# Patient Record
Sex: Male | Born: 1980 | Race: White | Hispanic: No | Marital: Married | Smoking: Former smoker
Health system: Southern US, Community
[De-identification: ages and names within clinical notes are randomized; demographics above are authoritative.]

## PROBLEM LIST (undated history)

## (undated) ENCOUNTER — Emergency Department: Discharge status: Absent without leave | Payer: 59

## (undated) DIAGNOSIS — F419 Anxiety disorder, unspecified: Secondary | ICD-10-CM

## (undated) DIAGNOSIS — F319 Bipolar disorder, unspecified: Secondary | ICD-10-CM

## (undated) DIAGNOSIS — F329 Major depressive disorder, single episode, unspecified: Secondary | ICD-10-CM

## (undated) DIAGNOSIS — F1020 Alcohol dependence, uncomplicated: Secondary | ICD-10-CM

## (undated) DIAGNOSIS — F1010 Alcohol abuse, uncomplicated: Secondary | ICD-10-CM

## (undated) DIAGNOSIS — F32A Depression, unspecified: Secondary | ICD-10-CM

## (undated) DIAGNOSIS — Z87898 Personal history of other specified conditions: Secondary | ICD-10-CM

## (undated) DIAGNOSIS — Z818 Family history of other mental and behavioral disorders: Secondary | ICD-10-CM

## (undated) DIAGNOSIS — E669 Obesity, unspecified: Secondary | ICD-10-CM

## (undated) DIAGNOSIS — F988 Other specified behavioral and emotional disorders with onset usually occurring in childhood and adolescence: Secondary | ICD-10-CM

## (undated) DIAGNOSIS — F1921 Other psychoactive substance dependence, in remission: Secondary | ICD-10-CM

## (undated) DIAGNOSIS — F1991 Other psychoactive substance use, unspecified, in remission: Secondary | ICD-10-CM

## (undated) DIAGNOSIS — Z7289 Other problems related to lifestyle: Secondary | ICD-10-CM

## (undated) DIAGNOSIS — I10 Essential (primary) hypertension: Secondary | ICD-10-CM

## (undated) DIAGNOSIS — Z789 Other specified health status: Secondary | ICD-10-CM

## (undated) HISTORY — DX: Family history of other mental and behavioral disorders: Z81.8

## (undated) HISTORY — DX: Personal history of other specified conditions: Z87.898

## (undated) HISTORY — DX: Essential (primary) hypertension: I10

## (undated) HISTORY — DX: Alcohol abuse, uncomplicated: F10.10

## (undated) HISTORY — DX: Obesity, unspecified: E66.9

## (undated) HISTORY — DX: Other specified health status: Z78.9

## (undated) HISTORY — DX: Other psychoactive substance use, unspecified, in remission: F19.91

## (undated) HISTORY — DX: Other specified behavioral and emotional disorders with onset usually occurring in childhood and adolescence: F98.8

## (undated) HISTORY — DX: Anxiety disorder, unspecified: F41.9

## (undated) HISTORY — DX: Bipolar disorder, unspecified: F31.9

## (undated) HISTORY — DX: Alcohol dependence, uncomplicated: F10.20

## (undated) HISTORY — DX: Depression, unspecified: F32.A

## (undated) HISTORY — DX: Other problems related to lifestyle: Z72.89

## (undated) HISTORY — DX: Other psychoactive substance dependence, in remission: F19.21

---
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
## 2011-07-31 ENCOUNTER — Emergency Department
Admission: EM | Admit: 2011-07-31 | Discharge: 2011-07-31 | Disposition: A | Discharge status: Home / Self Care | Payer: Medicaid Other | Source: Home / Self Care

## 2011-07-31 ENCOUNTER — Encounter: Payer: Self-pay | Admitting: *Deleted

## 2011-07-31 DIAGNOSIS — J069 Acute upper respiratory infection, unspecified: Secondary | ICD-10-CM

## 2011-07-31 DIAGNOSIS — R05 Cough: Secondary | ICD-10-CM

## 2011-07-31 MED ORDER — AZITHROMYCIN 250 MG PO TABS: ORAL_TABLET | ORAL | Status: AC

## 2011-07-31 MED ORDER — GUAIFENESIN-CODEINE 100-10 MG/5ML PO SYRP: 5.0000 mL | ORAL_SOLUTION | Freq: Four times a day (QID) | ORAL | Status: AC | PRN

## 2011-07-31 NOTE — ED Notes (Signed)
Patient c/o non-productive cough and congestion x 4-5 days. Dizziness. Taken tylenol and zicam. Denies fever.

## 2011-07-31 NOTE — ED Provider Notes (Signed)
History     CSN: [id number]  Arrival date & time 07/31/11  6962   None     Chief Complaint  Patient presents with  . URI    (Consider location/radiation/quality/duration/timing/severity/associated sxs/prior treatment) HPI Supreme is a 31 y.o. male who complains of onset of cold symptoms for 4 days. + Flu shot. No sore throat + cough No pleuritic pain No wheezing + nasal congestion + post-nasal drainage No sinus pain/pressure + chest congestion No itchy/red eyes No earache No hemoptysis No SOB No chills/sweats No fever No nausea No vomiting No abdominal pain No diarrhea No skin rashes + fatigue No myalgias No headache    History reviewed. No pertinent past medical history.  History reviewed. No pertinent past surgical history.  History reviewed. No pertinent family history.  History  Substance Use Topics  . Smoking status: Never Smoker   . Smokeless tobacco: Not on file  . Alcohol Use: Yes      Review of Systems  Allergies  Review of patient's allergies indicates no known allergies.  Home Medications  No current outpatient prescriptions on file.  There were no vitals taken for this visit.  Physical Exam  Nursing note and vitals reviewed. Constitutional: He is oriented to person, place, and time. He appears well-developed and well-nourished.  HENT:  Head: Normocephalic and atraumatic.  Right Ear: Tympanic membrane, external ear and ear canal normal.  Left Ear: Tympanic membrane, external ear and ear canal normal.  Nose: Mucosal edema and rhinorrhea present.  Mouth/Throat: Posterior oropharyngeal erythema present. No oropharyngeal exudate or posterior oropharyngeal edema.       Cerumen impaction bilateral  Eyes: No scleral icterus.  Neck: Neck supple.  Cardiovascular: Regular rhythm and normal heart sounds.   Pulmonary/Chest: Effort normal and breath sounds normal. No respiratory distress.  Neurological: He is alert and oriented to person,  place, and time.  Skin: Skin is warm and dry.  Psychiatric: He has a normal mood and affect. His speech is normal.    ED Course  Procedures (including critical care time)  Labs Reviewed - No data to display No results found.   No diagnosis found.    MDM  1)  Take the prescribed antibiotic as instructed.   I have advised him to hold the antibiotics for a few days since is most likely viral. I gave him a note for work tonight because of prefer that he is not around any of patient's. We also gave him cough medicine with codeine. 2)  Use nasal saline solution (over the counter) at least 3 times a day. 3)  Use over the counter decongestants like Zyrtec-D every 12 hours as needed to help with congestion.  If you have hypertension, do not take medicines with sudafed.  4)  Can take tylenol every 6 hours or motrin every 8 hours for pain or fever. 5)  Follow up with your primary doctor if no improvement in 5-7 days, sooner if increasing pain, fever, or new symptoms.       Lily Kocher, MD 07/31/11 [phone]

## 2018-11-03 ENCOUNTER — Emergency Department (INDEPENDENT_AMBULATORY_CARE_PROVIDER_SITE_OTHER): Payer: 59

## 2018-11-03 ENCOUNTER — Encounter: Payer: Self-pay | Admitting: *Deleted

## 2018-11-03 ENCOUNTER — Other Ambulatory Visit: Payer: Self-pay

## 2018-11-03 ENCOUNTER — Emergency Department (INDEPENDENT_AMBULATORY_CARE_PROVIDER_SITE_OTHER)
Admission: EM | Admit: 2018-11-03 | Discharge: 2018-11-03 | Disposition: A | Discharge status: Home / Self Care | Payer: 59 | Source: Home / Self Care

## 2018-11-03 DIAGNOSIS — W010XXA Fall on same level from slipping, tripping and stumbling without subsequent striking against object, initial encounter: Secondary | ICD-10-CM | POA: Diagnosis not present

## 2018-11-03 DIAGNOSIS — S299XXA Unspecified injury of thorax, initial encounter: Secondary | ICD-10-CM | POA: Diagnosis not present

## 2018-11-03 DIAGNOSIS — R0781 Pleurodynia: Secondary | ICD-10-CM

## 2018-11-03 MED ORDER — TRAMADOL HCL 50 MG PO TABS: 50.0000 mg | ORAL_TABLET | Freq: Four times a day (QID) | ORAL | 0 refills | Status: DC | PRN

## 2018-11-03 MED ORDER — METHOCARBAMOL 500 MG PO TABS
500.0000 mg | ORAL_TABLET | Freq: Two times a day (BID) | ORAL | 0 refills | Status: DC
Start: 2018-11-03 — End: 2019-06-17

## 2018-11-03 NOTE — ED Provider Notes (Signed)
[facility]    CSN: [id number] Arrival date & time: 11/03/18  7253     History   Chief Complaint Chief Complaint  Patient presents with  . Rib Injury    HPI Nicholas Ortega is a 38 y.o. male.   HPI Nicholas Ortega is a 38 y.o. male presenting to UC with c/o Right sided chest, back, and rib pain after tripping on a step and falling over his son's toy scooter on Friday 10/29/2018.  Pain has gradually worsened in his ribs.  He initially had Right ankle pain but that has resolved.  He has taken Tylenol and ibuprofen but no relief of Right side pain.  Denies cough, congestion or SOB. Denies fever or chills. Denies hitting his head during the fall. No prior hx of back or rib problems.   History reviewed. No pertinent past medical history.  There are no active problems to display for this patient.   History reviewed. No pertinent surgical history.     Home Medications    Prior to Admission medications   Medication Sig Start Date End Date Taking? Authorizing Provider  methocarbamol (ROBAXIN) 500 MG tablet Take 1 tablet (500 mg total) by mouth 2 (two) times daily. 11/03/18   Lurene Shadow, PA-C  traMADol (ULTRAM) 50 MG tablet Take 1 tablet (50 mg total) by mouth every 6 (six) hours as needed. 11/03/18   Lurene Shadow, PA-C    Family History Family History  Problem Relation Age of Onset  . Transient ischemic attack Father     Social History Social History   Tobacco Use  . Smoking status: Former Games developer  . Smokeless tobacco: Never Used  Substance Use Topics  . Alcohol use: Yes    Comment: occ  . Drug use: No     Allergies   Hydrocodone   Review of Systems Review of Systems  Cardiovascular: Positive for chest pain. Negative for palpitations.  Musculoskeletal: Positive for back pain and myalgias. Negative for neck pain and neck stiffness.  Skin: Negative for color change and wound.  Neurological: Negative for syncope, light-headedness and headaches.      Physical Exam Triage Vital Signs ED Triage Vitals  Enc Vitals Group     BP 11/03/18 0951 (!) 127/92     Pulse Rate 11/03/18 0951 97     Resp 11/03/18 0951 18     Temp 11/03/18 0951 98.1 F (36.7 C)     Temp Source 11/03/18 0951 Oral     SpO2 11/03/18 0951 97 %     Weight 11/03/18 0952 (!) 364 lb (165.1 kg)     Height 11/03/18 0952  (1.854 m)     Head Circumference --      Peak Flow --      Pain Score 11/03/18 0952 8     Pain Loc --      Pain Edu? --      Excl. in GC? --    No data found.  Updated Vital Signs BP (!) 127/92 (BP Location: Right Arm)   Pulse 97   Temp 98.1 F (36.7 C) (Oral)   Resp 18   Ht  (1.854 m)   Wt (!) 364 lb (165.1 kg)   SpO2 97%   BMI 48.02 kg/m   Visual Acuity Right Eye Distance:   Left Eye Distance:   Bilateral Distance:    Right Eye Near:   Left Eye Near:    Bilateral Near:     Physical  Exam Vitals signs and nursing note reviewed.  Constitutional:      Appearance: Normal appearance. He is well-developed.  HENT:     Head: Normocephalic and atraumatic.  Neck:     Musculoskeletal: Normal range of motion.  Cardiovascular:     Rate and Rhythm: Normal rate and regular rhythm.  Pulmonary:     Effort: Pulmonary effort is normal.     Breath sounds: Normal breath sounds.    Chest:     Chest wall: Tenderness present. No deformity, swelling or crepitus.    Musculoskeletal: Normal range of motion.  Skin:    General: Skin is warm and dry.  Neurological:     Mental Status: He is alert and oriented to person, place, and time.  Psychiatric:        Behavior: Behavior normal.      UC Treatments / Results  Labs (all labs ordered are listed, but only abnormal results are displayed) Labs Reviewed - No data to display  EKG None  Radiology Dg Ribs Unilateral W/chest Right  Result Date: 11/03/2018 CLINICAL DATA:  Anterior lower right rib pain after a fall on 10/29/2018 EXAM: RIGHT RIBS AND CHEST - 3+ VIEW COMPARISON:  None.  FINDINGS: No fracture or other bone lesions are seen involving the ribs. There is no evidence of pneumothorax or pleural effusion. Both lungs are clear. Heart size and mediastinal contours are within normal limits. The patient's reported pain is over the noncalcified costal cartilage region. IMPRESSION: No visible right rib fracture. Electronically Signed   By: Francene Boyers M.D.   On: 11/03/2018 10:16    Procedures Procedures (including critical care time)  Medications Ordered in UC Medications - No data to display  Initial Impression / Assessment and Plan / UC Course  I have reviewed the triage vital signs and the nursing notes.  Pertinent labs & imaging results that were available during my care of the patient were reviewed by me and considered in my medical decision making (see chart for details).    Reviewed imaging with pt. Reassured pt no fractures Will tx with Tramadol and Robaxin Also encouraged pt to try alternating cool and warm compresses F/u with PCP or Sports Medicine as needed  Final Clinical Impressions(s) / UC Diagnoses   Final diagnoses:  Fall from slip, trip, or stumble, initial encounter  Rib pain on right side     Discharge Instructions      Tramadol is strong pain medication. While taking, do not drink alcohol, drive, or perform any other activities that requires focus while taking these medications.   Robaxin (methocarbamol) is a muscle relaxer and may cause drowsiness. Do not drink alcohol, drive, or operate heavy machinery while taking.  Please follow up with family medicine or sports medicine in 1 week if not improving, sooner if your develop difficulty breathing, a cough, or fever. Try to breath normally to help lower your risk of developing pneumonia from shallow breathing caused by pain.    ED Prescriptions    Medication Sig Dispense Auth. Provider   traMADol (ULTRAM) 50 MG tablet Take 1 tablet (50 mg total) by mouth every 6 (six) hours as  needed. 15 tablet Doroteo Glassman, Aerielle Stoklosa O, PA-C   methocarbamol (ROBAXIN) 500 MG tablet Take 1 tablet (500 mg total) by mouth 2 (two) times daily. 20 tablet Lurene Shadow, PA-C     Controlled Substance Prescriptions [place] Controlled Substance Registry consulted? Yes, I have consulted the [place] Controlled Substances Registry for this patient, and  feel the risk/benefit ratio today is favorable for proceeding with this prescription for a controlled substance.   Lurene Shadowhelps, Javonne Louissaint O, [place] JerseyPA-C 11/03/18 [phone]

## 2018-11-03 NOTE — Discharge Instructions (Signed)
°  Tramadol is strong pain medication. While taking, do not drink alcohol, drive, or perform any other activities that requires focus while taking these medications.   Robaxin (methocarbamol) is a muscle relaxer and may cause drowsiness. Do not drink alcohol, drive, or operate heavy machinery while taking.  Please follow up with family medicine or sports medicine in 1 week if not improving, sooner if your develop difficulty breathing, a cough, or fever. Try to breath normally to help lower your risk of developing pneumonia from shallow breathing caused by pain.

## 2018-11-03 NOTE — ED Triage Notes (Signed)
Pt c/o RT rib, chest and back pain post fall at home x 5 days ago. He took IBF without relief, none x 2 days.

## 2018-12-13 DIAGNOSIS — F331 Major depressive disorder, recurrent, moderate: Secondary | ICD-10-CM | POA: Diagnosis not present

## 2018-12-20 DIAGNOSIS — F331 Major depressive disorder, recurrent, moderate: Secondary | ICD-10-CM | POA: Diagnosis not present

## 2019-01-03 DIAGNOSIS — F331 Major depressive disorder, recurrent, moderate: Secondary | ICD-10-CM | POA: Diagnosis not present

## 2019-01-10 DIAGNOSIS — F331 Major depressive disorder, recurrent, moderate: Secondary | ICD-10-CM | POA: Diagnosis not present

## 2019-01-17 DIAGNOSIS — F331 Major depressive disorder, recurrent, moderate: Secondary | ICD-10-CM | POA: Diagnosis not present

## 2019-01-25 DIAGNOSIS — F331 Major depressive disorder, recurrent, moderate: Secondary | ICD-10-CM | POA: Diagnosis not present

## 2019-02-08 DIAGNOSIS — F331 Major depressive disorder, recurrent, moderate: Secondary | ICD-10-CM | POA: Diagnosis not present

## 2019-02-14 DIAGNOSIS — F331 Major depressive disorder, recurrent, moderate: Secondary | ICD-10-CM | POA: Diagnosis not present

## 2019-02-23 DIAGNOSIS — F331 Major depressive disorder, recurrent, moderate: Secondary | ICD-10-CM | POA: Diagnosis not present

## 2019-02-28 ENCOUNTER — Ambulatory Visit (INDEPENDENT_AMBULATORY_CARE_PROVIDER_SITE_OTHER): Payer: 59 | Admitting: Adult Health

## 2019-02-28 ENCOUNTER — Other Ambulatory Visit: Payer: Self-pay

## 2019-02-28 ENCOUNTER — Encounter: Payer: Self-pay | Admitting: Adult Health

## 2019-02-28 VITALS — BP 149/33 | HR 83 | Ht 73.0 in | Wt 360.0 lb

## 2019-02-28 DIAGNOSIS — F902 Attention-deficit hyperactivity disorder, combined type: Secondary | ICD-10-CM | POA: Diagnosis not present

## 2019-02-28 DIAGNOSIS — F331 Major depressive disorder, recurrent, moderate: Secondary | ICD-10-CM

## 2019-02-28 DIAGNOSIS — F411 Generalized anxiety disorder: Secondary | ICD-10-CM

## 2019-02-28 DIAGNOSIS — G4700 Insomnia, unspecified: Secondary | ICD-10-CM | POA: Diagnosis not present

## 2019-02-28 MED ORDER — ARIPIPRAZOLE 5 MG PO TABS: 5.0000 mg | ORAL_TABLET | Freq: Every day | ORAL | 2 refills | Status: DC

## 2019-02-28 NOTE — Progress Notes (Signed)
[facility] MD/PA/NP Initial Note  02/28/2019 3:55 PM Armstrong Creasy  MRN:  [id number]  Chief Complaint:  Chief Complaint    Anxiety; Depression; Insomnia      HPI:   Describes mood today as "ok". Pleasant. Mood symptoms - reports depression, anxiety, and irritability. Stating "I feel that way al the time". Has been seeing a counselor in [place] since May of this year. Working on Radiographer, therapeutic. Has had a "difficult year". Going though a divorce - married x 10 years. Stating "we are done". Continues to have issues with "custody". Has joint custody right now - has children 80% of the time. Has a 47 year old girl (autistic) and 55 year old year son. Separated over a year. Lost a friend to suicide in November. Has suffered with anxiety and depression his whole life. Was diagnosed with ADHD in childhood and was trialed on different medications. Saw a psychiatrist a few years ago - "they wanted to put him on a lot of medications and I couldn't do it". 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 Having "a lot of highs and lows". Consumes ETOH - 4 bottles of wine a week. Smokes THC for anxiety and depression - "it helps most of the time.  Stable interest and motivation. Taking medications as prescribed.  Energy levels stable. Active, does not have a regular exercise routine.  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  Enjoys some usual interests and activities. Spending time with family - 2 children and 1 dog.  Appetite adequate. Weight loss - "trying to take care of self mentally and physically".  Not sleeping well at night. Works night shift. Has a "difficult time falling asleep". Gets anxious at bedtime. Averages 4 to 5 hours. Focus and concentration stable. Completing tasks. Managing aspects of household. Doing well in work setting.  Denies SI or HI. Denies AH or VH.  Previous medications: Ritalin, Wellbutrin, Lexapro - SSE  Visit Diagnosis:    ICD-10-CM   1. Attention  deficit hyperactivity disorder (ADHD), combined type  F90.2 ARIPiprazole (ABILIFY) 5 MG tablet  2. Generalized anxiety disorder  F41.1 ARIPiprazole (ABILIFY) 5 MG tablet  3. Major depressive disorder, recurrent episode, moderate (HCC)  F33.1 ARIPiprazole (ABILIFY) 5 MG tablet  4. Insomnia, unspecified type  G47.00 ARIPiprazole (ABILIFY) 5 MG tablet    Past Psychiatric History: Denies  Past Medical History: No past medical history on file. No past surgical history on file.  Family Psychiatric History: Both parents - depression.  Family History:  Family History  Problem Relation Age of Onset  . Transient ischemic attack Father     Social History:  Social History   Socioeconomic History  . Marital status: Married    Spouse name: Not on file  . Number of children: Not on file  . Years of education: Not on file  . Highest education level: Not on file  Occupational History  . Not on file  Social Needs  . Financial resource strain: Not on file  . Food insecurity    Worry: Not on file    Inability: Not on file  . Transportation needs    Medical: Not on file    Non-medical: Not on file  Tobacco Use  . Smoking status: Former Research scientist (life sciences)  . Smokeless tobacco: Never Used  Substance and Sexual Activity  . Alcohol use: Yes    Comment: occ  . Drug use: No  . Sexual activity: Not on file  Lifestyle  .  Physical activity    Days per week: Not on file    Minutes per session: Not on file  . Stress: Not on file  Relationships  . Social Musicianconnections    Talks on phone: Not on file    Gets together: Not on file    Attends religious service: Not on file    Active member of club or organization: Not on file    Attends meetings of clubs or organizations: Not on file    Relationship status: Not on file  Other Topics Concern  . Not on file  Social History Narrative  . Not on file    Allergies:  Allergies  Allergen Reactions  . Hydrocodone Palpitations    Metabolic Disorder  Labs: No results found for: HGBA1C, MPG No results found for: PROLACTIN No results found for: CHOL, TRIG, HDL, CHOLHDL, VLDL, LDLCALC No results found for: TSH  Therapeutic Level Labs: No results found for: LITHIUM No results found for: VALPROATE No components found for:  CBMZ  Current Medications: Current Outpatient Medications  Medication Sig Dispense Refill  . ARIPiprazole (ABILIFY) 5 MG tablet Take 1 tablet (5 mg total) by mouth daily. 30 tablet 2  . methocarbamol (ROBAXIN) 500 MG tablet Take 1 tablet (500 mg total) by mouth 2 (two) times daily. (Patient not taking: Reported on 02/28/2019) 20 tablet 0  . traMADol (ULTRAM) 50 MG tablet Take 1 tablet (50 mg total) by mouth every 6 (six) hours as needed. (Patient not taking: Reported on 02/28/2019) 15 tablet 0   No current facility-administered medications for this visit.     Medication Side Effects: none  Orders placed this visit:  No orders of the defined types were placed in this encounter.   Psychiatric Specialty Exam:  Review of Systems  Neurological: Negative for tremors and weakness.  Psychiatric/Behavioral: Positive for depression. The patient is nervous/anxious and has insomnia.     Blood pressure (!) 149/33, pulse 83, height 6\' 1"  (1.854 m), weight (!) 360 lb (163.3 kg).Body mass index is 47.5 kg/m.  General Appearance: Neat and Well Groomed  Eye Contact:  Good  Speech:  Normal Rate and Pressured  Volume:  Normal  Mood:  Anxious and Depressed  Affect:  Appropriate  Thought Process:  Coherent  Orientation:  Full (Time, Place, and Person)  Thought Content: Logical   Suicidal Thoughts:  No  Homicidal Thoughts:  No  Memory:  WNL  Judgement:  Good  Insight:  Good  Psychomotor Activity:  Negative  Concentration:  Concentration: Good  Recall:  NA  Fund of Knowledge: Good  Language: Good  Assets:  Communication Skills Desire for Improvement Financial Resources/Insurance Housing Intimacy Leisure  Time Physical Health Resilience Social Support Talents/Skills Transportation Vocational/Educational  ADL's:  Intact  Cognition: WNL  Prognosis:  Good   Screenings: None   Receiving Psychotherapy: No   Treatment Plan/Recommendations:   Plan:  1. Add Abilify 5mg  tablet - take 1/2 tab at hs x 3 nights, then one tablet at hs.  Advised to reduce ETOH intake   RTC 4 weeks  Patient advised to contact office with any questions, adverse effects, or acute worsening in signs and symptoms.  Discussed potential metabolic side effects associated with atypical antipsychotics, as well as potential risk for movement side effects. Advised pt to contact office if movement side effects occur.     Dorothyann Gibbsegina N Jahira Swiss, NP

## 2019-03-09 DIAGNOSIS — F331 Major depressive disorder, recurrent, moderate: Secondary | ICD-10-CM | POA: Diagnosis not present

## 2019-03-16 DIAGNOSIS — F331 Major depressive disorder, recurrent, moderate: Secondary | ICD-10-CM | POA: Diagnosis not present

## 2019-03-23 DIAGNOSIS — F331 Major depressive disorder, recurrent, moderate: Secondary | ICD-10-CM | POA: Diagnosis not present

## 2019-03-25 DIAGNOSIS — F331 Major depressive disorder, recurrent, moderate: Secondary | ICD-10-CM | POA: Diagnosis not present

## 2019-03-28 ENCOUNTER — Encounter: Payer: Self-pay | Admitting: Adult Health

## 2019-03-28 ENCOUNTER — Ambulatory Visit (INDEPENDENT_AMBULATORY_CARE_PROVIDER_SITE_OTHER): Payer: 59 | Admitting: Adult Health

## 2019-03-28 ENCOUNTER — Other Ambulatory Visit: Payer: Self-pay

## 2019-03-28 DIAGNOSIS — F411 Generalized anxiety disorder: Secondary | ICD-10-CM | POA: Diagnosis not present

## 2019-03-28 DIAGNOSIS — G4700 Insomnia, unspecified: Secondary | ICD-10-CM | POA: Diagnosis not present

## 2019-03-28 DIAGNOSIS — F902 Attention-deficit hyperactivity disorder, combined type: Secondary | ICD-10-CM

## 2019-03-28 DIAGNOSIS — F331 Major depressive disorder, recurrent, moderate: Secondary | ICD-10-CM

## 2019-03-28 MED ORDER — ARIPIPRAZOLE 10 MG PO TABS: 10.0000 mg | ORAL_TABLET | Freq: Every day | ORAL | 2 refills | Status: DC

## 2019-03-28 MED ORDER — HYDROXYZINE PAMOATE 25 MG PO CAPS: 25.0000 mg | ORAL_CAPSULE | Freq: Four times a day (QID) | ORAL | 0 refills | Status: DC | PRN

## 2019-03-28 NOTE — Progress Notes (Signed)
[facility] MD/PA/NP Initial Note  03/28/2019 4:14 PM Kdyn Vonbehren  MRN:  [id number]  Chief Complaint:    HPI:   Describes mood today as "ok". Pleasant. Mood symptoms - reports decreased depression, anxiety, and irritability. Stating "It's like it is all a little easier". Has been able to "get up and get out of the bed". Taking Abilify for about 3 weeks now. Sleep is "good", "better". Having spells of anxiety. One panic attack in 3 weeks. Feels really nervous about going out and being around people. Seeing therapist twice a week. Decreased alcohol and THC use. He and wife plan to see a therapist to help one another communicate "for children's best interests". Improved interest and motivation. Taking medications as prescribed.  Energy levels stable. Active, does not have a regular exercise routine. 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  Enjoys some usual interests and activities. Spending time with family - 2 children and 1 dog. Sharing custody with wife.  Appetite adequate. Weight loss - "medication has not effected appetite".  Not sleeping well at night. Works night shift. Has a "difficult time falling asleep". Gets anxious at bedtime. Averages 5 to 6  Hours of solid sleep - "I'm very happy with that". Focus and concentration stable. Completing tasks. Managing aspects of household. Doing well in work setting.  Denies SI or HI. Denies AH or VH.  Previous medications: Ritalin, Wellbutrin, Lexapro - SSE  Visit Diagnosis:    ICD-10-CM   1. Insomnia, unspecified type  G47.00 ARIPiprazole (ABILIFY) 10 MG tablet    hydrOXYzine (VISTARIL) 25 MG capsule  2. Major depressive disorder, recurrent episode, moderate (HCC)  F33.1 ARIPiprazole (ABILIFY) 10 MG tablet  3. Generalized anxiety disorder  F41.1 ARIPiprazole (ABILIFY) 10 MG tablet    hydrOXYzine (VISTARIL) 25 MG capsule  4. Attention deficit hyperactivity disorder (ADHD), combined type  F90.2 ARIPiprazole (ABILIFY) 10 MG tablet     Past Psychiatric History: Denies  Past Medical History: No past medical history on file. No past surgical history on file.  Family Psychiatric History: Both parents - depression.  Family History:  Family History  Problem Relation Age of Onset  . Transient ischemic attack Father     Social History:  Social History   Socioeconomic History  . Marital status: Married    Spouse name: Not on file  . Number of children: Not on file  . Years of education: Not on file  . Highest education level: Not on file  Occupational History  . Not on file  Social Needs  . Financial resource strain: Not on file  . Food insecurity    Worry: Not on file    Inability: Not on file  . Transportation needs    Medical: Not on file    Non-medical: Not on file  Tobacco Use  . Smoking status: Former Games developer  . Smokeless tobacco: Never Used  Substance and Sexual Activity  . Alcohol use: Yes    Comment: occ  . Drug use: No  . Sexual activity: Not on file  Lifestyle  . Physical activity    Days per week: Not on file    Minutes per session: Not on file  . Stress: Not on file  Relationships  . Social Musician on phone: Not on file    Gets together: Not on file    Attends religious service: Not on file    Active member of club or organization: Not on file    Attends  meetings of clubs or organizations: Not on file    Relationship status: Not on file  Other Topics Concern  . Not on file  Social History Narrative  . Not on file    Allergies:  Allergies  Allergen Reactions  . Hydrocodone Palpitations    Metabolic Disorder Labs: No results found for: HGBA1C, MPG No results found for: PROLACTIN No results found for: CHOL, TRIG, HDL, CHOLHDL, VLDL, LDLCALC No results found for: TSH  Therapeutic Level Labs: No results found for: LITHIUM No results found for: VALPROATE No components found for:  CBMZ  Current Medications: Current Outpatient Medications  Medication Sig  Dispense Refill  . ARIPiprazole (ABILIFY) 10 MG tablet Take 1 tablet (10 mg total) by mouth daily. 30 tablet 2  . hydrOXYzine (VISTARIL) 25 MG capsule Take 1 capsule (25 mg total) by mouth every 6 (six) hours as needed. 120 capsule 0  . methocarbamol (ROBAXIN) 500 MG tablet Take 1 tablet (500 mg total) by mouth 2 (two) times daily. (Patient not taking: Reported on 02/28/2019) 20 tablet 0  . traMADol (ULTRAM) 50 MG tablet Take 1 tablet (50 mg total) by mouth every 6 (six) hours as needed. (Patient not taking: Reported on 02/28/2019) 15 tablet 0   No current facility-administered medications for this visit.     Medication Side Effects: none  Orders placed this visit:  No orders of the defined types were placed in this encounter.   Psychiatric Specialty Exam:  Review of Systems  Neurological: Negative for tremors and weakness.  Psychiatric/Behavioral: Positive for depression. The patient is nervous/anxious and has insomnia.     There were no vitals taken for this visit.There is no height or weight on file to calculate BMI.  General Appearance: Neat and Well Groomed  Eye Contact:  Good  Speech:  Normal Rate and Pressured  Volume:  Normal  Mood:  Anxious and Depressed  Affect:  Appropriate  Thought Process:  Coherent  Orientation:  Full (Time, Place, and Person)  Thought Content: Logical   Suicidal Thoughts:  No  Homicidal Thoughts:  No  Memory:  WNL  Judgement:  Good  Insight:  Good  Psychomotor Activity:  Negative  Concentration:  Concentration: Good  Recall:  NA  Fund of Knowledge: Good  Language: Good  Assets:  Communication Skills Desire for Improvement Financial Resources/Insurance Housing Intimacy Leisure Time Physical Health Resilience Social Support Talents/Skills Transportation Vocational/Educational  ADL's:  Intact  Cognition: WNL  Prognosis:  Good   Screenings: None   Receiving Psychotherapy: No   Treatment Plan/Recommendations:   Plan:  1.  Increase Abilify 5mg  tablet to 10mg  daily. 2. Add Hydroxyzine 25mg  4 x daily to target anxiety  RTC 4 weeks  Patient advised to contact office with any questions, adverse effects, or acute worsening in signs and symptoms.  Discussed potential metabolic side effects associated with atypical antipsychotics, as well as potential risk for movement side effects. Advised pt to contact office if movement side effects occur.     Aloha Gell, NP

## 2019-03-30 DIAGNOSIS — F331 Major depressive disorder, recurrent, moderate: Secondary | ICD-10-CM | POA: Diagnosis not present

## 2019-04-20 DIAGNOSIS — F331 Major depressive disorder, recurrent, moderate: Secondary | ICD-10-CM | POA: Diagnosis not present

## 2019-04-26 DIAGNOSIS — F331 Major depressive disorder, recurrent, moderate: Secondary | ICD-10-CM | POA: Diagnosis not present

## 2019-05-02 ENCOUNTER — Ambulatory Visit (INDEPENDENT_AMBULATORY_CARE_PROVIDER_SITE_OTHER): Payer: 59 | Admitting: Adult Health

## 2019-05-02 ENCOUNTER — Encounter: Payer: Self-pay | Admitting: Adult Health

## 2019-05-02 DIAGNOSIS — F410 Panic disorder [episodic paroxysmal anxiety] without agoraphobia: Secondary | ICD-10-CM | POA: Diagnosis not present

## 2019-05-02 DIAGNOSIS — F411 Generalized anxiety disorder: Secondary | ICD-10-CM

## 2019-05-02 DIAGNOSIS — F902 Attention-deficit hyperactivity disorder, combined type: Secondary | ICD-10-CM | POA: Diagnosis not present

## 2019-05-02 DIAGNOSIS — G4700 Insomnia, unspecified: Secondary | ICD-10-CM

## 2019-05-02 DIAGNOSIS — F331 Major depressive disorder, recurrent, moderate: Secondary | ICD-10-CM | POA: Diagnosis not present

## 2019-05-02 MED ORDER — BUSPIRONE HCL 10 MG PO TABS: 10.0000 mg | ORAL_TABLET | Freq: Three times a day (TID) | ORAL | 2 refills | Status: DC

## 2019-05-02 MED ORDER — ARIPIPRAZOLE 10 MG PO TABS: 10.0000 mg | ORAL_TABLET | Freq: Every day | ORAL | 1 refills | Status: DC

## 2019-05-02 NOTE — Progress Notes (Signed)
[facility] MD/PA/NP Medication Check  05/02/2019 3:58 PM Nicholas Ortega  MRN:  [id number]  Chief Complaint:    HPI:   Describes mood today as "ok". Pleasant. Mood symptoms - reports decreased depression, anxiety, and irritability. Stating "I'm doing better now". Continues to feel "anxious". Hydroxyzine did not help - "took it twice - nothing". Denies any "crippling" panic attacks. Increased Abilify to 10mg  - "can now step back and look at things". Easier to "let go of things". Not wanting to go down into the "dark tunnel". Stating "If I have a spell", "it's over quicker". Tried Hydroxyzine twice - "not real helpful". Seeing therapist twice a week. Decreased alcohol and THC use. He and wife unable to agree on a plan to see a therapist. Stable interest and motivation. Taking medications as prescribed.  Energy levels stable. Active, has a regular exercise routine - daily. Walking a few miles a day.Works full time at the hospital as a .  Enjoys some usual interests and activities. Spending time with family - 2 children (8 and 5) and 1 dog. Shares custody with ex-wife. Feels like she "throws a wrench into things", "not cordial". Stating "I have to be the one to let things go tokeep the peace".  Appetite adequate. Weight loss. Denies "emotional eating".  Sleeps well most nights. Averages 5 to 6 hours or more of "good sleep". Focus and concentration "better" Completing tasks. Managing aspects of household. Doing well in work setting.  Denies SI or HI. Denies AH or VH.  Previous medications: Ritalin, Wellbutrin, Lexapro - SSE  Visit Diagnosis:    ICD-10-CM   1. Insomnia, unspecified type  G47.00 ARIPiprazole (ABILIFY) 10 MG tablet  2. Attention deficit hyperactivity disorder (ADHD), combined type  F90.2 ARIPiprazole (ABILIFY) 10 MG tablet  3. Generalized anxiety disorder  F41.1 ARIPiprazole (ABILIFY) 10 MG tablet    busPIRone (BUSPAR) 10 MG tablet  4. Major depressive disorder, recurrent  episode, moderate (HCC)  F33.1 ARIPiprazole (ABILIFY) 10 MG tablet  5. Panic attacks  F41.0 busPIRone (BUSPAR) 10 MG tablet    Past Psychiatric History: Denies  Past Medical History: No past medical history on file. No past surgical history on file.  Family Psychiatric History: Both parents - depression.  Family History:  Family History  Problem Relation Age of Onset  . Transient ischemic attack Father     Social History:  Social History   Socioeconomic History  . Marital status: Married    Spouse name: Not on file  . Number of children: Not on file  . Years of education: Not on file  . Highest education level: Not on file  Occupational History  . Not on file  Social Needs  . Financial resource strain: Not on file  . Food insecurity    Worry: Not on file    Inability: Not on file  . Transportation needs    Medical: Not on file    Non-medical: Not on file  Tobacco Use  . Smoking status: Former Psychologist, sport and exercise  . Smokeless tobacco: Never Used  Substance and Sexual Activity  . Alcohol use: Yes    Comment: occ  . Drug use: No  . Sexual activity: Not on file  Lifestyle  . Physical activity    Days per week: Not on file    Minutes per session: Not on file  . Stress: Not on file  Relationships  . Social Games developer on phone: Not on file    Gets together: Not on file  Attends religious service: Not on file    Active member of club or organization: Not on file    Attends meetings of clubs or organizations: Not on file    Relationship status: Not on file  Other Topics Concern  . Not on file  Social History Narrative  . Not on file    Allergies:  Allergies  Allergen Reactions  . Hydrocodone Palpitations    Metabolic Disorder Labs: No results found for: HGBA1C, MPG No results found for: PROLACTIN No results found for: CHOL, TRIG, HDL, CHOLHDL, VLDL, LDLCALC No results found for: TSH  Therapeutic Level Labs: No results found for: LITHIUM No results  found for: VALPROATE No components found for:  CBMZ  Current Medications: Current Outpatient Medications  Medication Sig Dispense Refill  . ARIPiprazole (ABILIFY) 10 MG tablet Take 1 tablet (10 mg total) by mouth daily. 90 tablet 1  . busPIRone (BUSPAR) 10 MG tablet Take 1 tablet (10 mg total) by mouth 3 (three) times daily. 90 tablet 2  . methocarbamol (ROBAXIN) 500 MG tablet Take 1 tablet (500 mg total) by mouth 2 (two) times daily. (Patient not taking: Reported on 02/28/2019) 20 tablet 0  . traMADol (ULTRAM) 50 MG tablet Take 1 tablet (50 mg total) by mouth every 6 (six) hours as needed. (Patient not taking: Reported on 02/28/2019) 15 tablet 0   No current facility-administered medications for this visit.     Medication Side Effects: none  Orders placed this visit:  No orders of the defined types were placed in this encounter.   Psychiatric Specialty Exam:  Review of Systems  Neurological: Negative for tremors and weakness.  Psychiatric/Behavioral: Negative for depression. The patient is nervous/anxious. The patient does not have insomnia.     There were no vitals taken for this visit.There is no height or weight on file to calculate BMI.  General Appearance: Neat and Well Groomed  Eye Contact:  Good  Speech:  Normal Rate  Volume:  Normal  Mood:  Anxious and Depressed - decreased  Affect:  Appropriate  Thought Process:  Coherent  Orientation:  Full (Time, Place, and Person)  Thought Content: Logical   Suicidal Thoughts:  No  Homicidal Thoughts:  No  Memory:  WNL  Judgement:  Good  Insight:  Good  Psychomotor Activity:  Negative  Concentration:  Concentration: Good  Recall:  NA  Fund of Knowledge: Good  Language: Good  Assets:  Communication Skills Desire for Improvement Financial Resources/Insurance Housing Intimacy Leisure Time Physical Health Resilience Social Support Talents/Skills Transportation Vocational/Educational  ADL's:  Intact  Cognition: WNL   Prognosis:  Good   Screenings: None   Receiving Psychotherapy: No   Treatment Plan/Recommendations:   Plan:  1. Abilify 10mg  daily. 2. Discontinue Hydroxyzine 25mg  - 4 x daily to target anxiety 3. Add Buspar 10mg  TID - take 1/2 tab TID x 7, then one TID   Has ceased ETOH and THC use since last visit. Denies any urges.   RTC 4 weeks  Patient advised to contact office with any questions, adverse effects, or acute worsening in signs and symptoms.  Discussed potential metabolic side effects associated with atypical antipsychotics, as well as potential risk for movement side effects. Advised pt to contact office if movement side effects occur.     Aloha Gell, NP

## 2019-05-04 DIAGNOSIS — R0602 Shortness of breath: Secondary | ICD-10-CM | POA: Diagnosis not present

## 2019-05-04 DIAGNOSIS — Z79899 Other long term (current) drug therapy: Secondary | ICD-10-CM | POA: Diagnosis not present

## 2019-05-04 DIAGNOSIS — R0682 Tachypnea, not elsewhere classified: Secondary | ICD-10-CM | POA: Diagnosis not present

## 2019-05-04 DIAGNOSIS — Z885 Allergy status to narcotic agent status: Secondary | ICD-10-CM | POA: Diagnosis not present

## 2019-05-04 DIAGNOSIS — Z87891 Personal history of nicotine dependence: Secondary | ICD-10-CM | POA: Diagnosis not present

## 2019-05-04 DIAGNOSIS — I10 Essential (primary) hypertension: Secondary | ICD-10-CM | POA: Diagnosis not present

## 2019-05-04 DIAGNOSIS — Z20828 Contact with and (suspected) exposure to other viral communicable diseases: Secondary | ICD-10-CM | POA: Diagnosis not present

## 2019-05-05 DIAGNOSIS — F331 Major depressive disorder, recurrent, moderate: Secondary | ICD-10-CM | POA: Diagnosis not present

## 2019-05-05 MED FILL — ARIPIPRAZOLE 10 MG TABS: 10 | 90 days supply | Qty: 90 | Fill #0

## 2019-05-07 ENCOUNTER — Emergency Department (INDEPENDENT_AMBULATORY_CARE_PROVIDER_SITE_OTHER)
Admission: EM | Admit: 2019-05-07 | Discharge: 2019-05-07 | Disposition: A | Discharge status: Home / Self Care | Payer: 59 | Source: Home / Self Care | Attending: Family Medicine | Admitting: Family Medicine

## 2019-05-07 ENCOUNTER — Other Ambulatory Visit: Payer: Self-pay

## 2019-05-07 ENCOUNTER — Encounter: Payer: Self-pay | Admitting: Emergency Medicine

## 2019-05-07 DIAGNOSIS — J209 Acute bronchitis, unspecified: Secondary | ICD-10-CM

## 2019-05-07 DIAGNOSIS — J302 Other seasonal allergic rhinitis: Secondary | ICD-10-CM

## 2019-05-07 MED ORDER — PREDNISONE 20 MG PO TABS: ORAL_TABLET | ORAL | 0 refills | Status: DC

## 2019-05-07 MED ORDER — ALBUTEROL SULFATE HFA 108 (90 BASE) MCG/ACT IN AERS: 1.0000 | INHALATION_SPRAY | RESPIRATORY_TRACT | 0 refills | Status: DC | PRN

## 2019-05-07 MED ORDER — AZITHROMYCIN 250 MG PO TABS: ORAL_TABLET | ORAL | 0 refills | Status: DC

## 2019-05-07 NOTE — ED Triage Notes (Signed)
Patient here for follow up to ER visit on 05/04/19. He was evaluated for COVID, Flu, RSV, PE and pneumonia and all testing negative. His shortness of breath has been worsening yesterday and today and he has used the albuterol inhaler twice today; he is in no acute distress but is concerned.

## 2019-05-07 NOTE — Discharge Instructions (Addendum)
Take plain guaifenesin (1200mg  extended release tabs such as Mucinex) twice daily, with plenty of water, for cough and congestion.  May add Pseudoephedrine (30mg , one or two every 4 to 6 hours) for sinus congestion.  Get adequate rest.   May take Delsym Cough Suppressant at bedtime for nighttime cough.  Try warm salt water gargles for sore throat.  Stop all antihistamines for now, and other non-prescription cough/cold preparations.  Recommend a Tdap when well.

## 2019-05-07 NOTE — ED Provider Notes (Signed)
[facility]    CSN: [id number] Arrival date & time: 05/07/19  1104      History   Chief Complaint Chief Complaint  Patient presents with  . Shortness of Breath    HPI Nicholas Ortega is a 38 y.o. male.   About 5 days ago patient developed fever, non-productive cough, fatigue, and shortness of breath.  He has seasonal allergies and had been taking Benadryl.  He became acutely short of breath and presented to local ER 3 days ago where he was found to be significantly hypoxic.  He had an extensive negative evaluation including rule-out of PE, pneumonia, MI, Flu, RSV, COVID19, etc. He received Decadron injection while in the ER and discharged with albuterol inhaler and Rx for prednisone (patient apparently did not know he had received prednisone Rx).  He had significant improvement the day after discharge, but has been becoming short of breath again, improved with the albuterol inhaler.  The history is provided by the patient.    History reviewed. No past medical history of asthma.  Active problems:  ADHD, generalized anxiety disorder, major depressive disorder.   History reviewed. No surgical history.     Home Medications    Prior to Admission medications   Medication Sig Start Date End Date Taking? Authorizing Provider  albuterol (VENTOLIN HFA) 108 (90 Base) MCG/ACT inhaler Inhale 1-2 puffs into the lungs every 4 (four) hours as needed for wheezing or shortness of breath. 05/07/19   Kandra Nicolas, MD  ARIPiprazole (ABILIFY) 10 MG tablet Take 1 tablet (10 mg total) by mouth daily. 05/02/19   Mozingo, Berdie Ogren, NP  azithromycin (ZITHROMAX Z-PAK) 250 MG tablet Take 2 tabs today; then begin one tab once daily for 4 more days. 05/07/19   Kandra Nicolas, MD  busPIRone (BUSPAR) 10 MG tablet Take 1 tablet (10 mg total) by mouth 3 (three) times daily. 05/02/19   Mozingo, Berdie Ogren, NP  methocarbamol (ROBAXIN) 500 MG tablet Take 1 tablet (500 mg total) by mouth 2  (two) times daily. Patient not taking: Reported on 02/28/2019 11/03/18   Noe Gens, PA-C  predniSONE (DELTASONE) 20 MG tablet Take one tab by mouth twice daily for 4 days, then one daily for 3 days. Take with food. 05/07/19   Kandra Nicolas, MD  traMADol (ULTRAM) 50 MG tablet Take 1 tablet (50 mg total) by mouth every 6 (six) hours as needed. Patient not taking: Reported on 02/28/2019 11/03/18   Tyrell Antonio    Family History Family History  Problem Relation Age of Onset  . Transient ischemic attack Father     Social History Social History   Tobacco Use  . Smoking status: Former Research scientist (life sciences)  . Smokeless tobacco: Never Used  Substance Use Topics  . Alcohol use: Yes    Comment: occ  . Drug use: No     Allergies   Hydrocodone   Review of Systems Review of Systems No sore throat + cough No pleuritic pain ? wheezing + nasal congestion + post-nasal drainage No sinus pain/pressure No itchy/red eyes No earache No hemoptysis + SOB + fever, resolved No nausea No vomiting No abdominal pain No diarrhea No urinary symptoms No skin rash + fatigue No myalgias No headache Used OTC meds (Benadryl) without relief   Physical Exam Triage Vital Signs ED Triage Vitals  Enc Vitals Group     BP 05/07/19 1159 137/85     Pulse Rate 05/07/19 1159 92  Resp 05/07/19 1159 18     Temp 05/07/19 1159 98.7 F (37.1 C)     Temp Source 05/07/19 1159 Oral     SpO2 05/07/19 1159 95 %     Weight 05/07/19 1200 (!) 339 lb 8.1 oz (154 kg)     Height 05/07/19 1200 6\' 1"  (1.854 m)     Head Circumference --      Peak Flow --      Pain Score 05/07/19 1200 0     Pain Loc --      Pain Edu? --      Excl. in GC? --    No data found.  Updated Vital Signs BP 137/85 (BP Location: Right Arm)   Pulse 92   Temp 98.7 F (37.1 C) (Oral)   Resp 18   Ht 6\' 1"  (1.854 m)   Wt (!) 154 kg   SpO2 95%   BMI 44.79 kg/m   Visual Acuity Right Eye Distance:   Left Eye Distance:    Bilateral Distance:    Right Eye Near:   Left Eye Near:    Bilateral Near:     Physical Exam Nursing notes and Vital Signs reviewed. Appearance:  Patient appears stated age, and in no acute distress Eyes:  Pupils are equal, round, and reactive to light and accomodation.  Extraocular movement is intact.  Conjunctivae are not inflamed  Ears:  Canals normal.  Tympanic membranes normal.  Nose:  Mildly congested turbinates.  No sinus tenderness.  Pharynx:  Normal Neck:  Supple. No adenopathy. Lungs:  Faint bilateral expiratory wheezes heard.  Breath sounds are equal.  Moving air well. Heart:  Regular rate and rhythm without murmurs, rubs, or gallops.  Abdomen:  Nontender without masses or hepatosplenomegaly.  Bowel sounds are present.  No CVA or flank tenderness.  Extremities:  No edema.  Skin:  No rash present.    UC Treatments / Results  Labs (all labs ordered are listed, but only abnormal results are displayed) Labs Reviewed - No data to display  EKG   Radiology No results found.  Procedures Procedures (including critical care time)  Medications Ordered in UC Medications - No data to display  Initial Impression / Assessment and Plan / UC Course  I have reviewed the triage vital signs and the nursing notes.  Pertinent labs & imaging results that were available during my care of the patient were reviewed by me and considered in my medical decision making (see chart for details).    Suspect that patient's recent dyspnea, bronchospasm, and hypoxia were initiated by a combination of viral URI and seasonal allergy symptoms.  Extensive negative evaluation at ER reassuring.   Begin prednisone burst/taper and Z-pak for atypical coverage. Refill albuterol MDI. Followup with Family Doctor if not improved in about 6 days.   Final Clinical Impressions(s) / UC Diagnoses   Final diagnoses:  Bronchitis with bronchospasm  Seasonal allergic rhinitis, unspecified trigger      Discharge Instructions     Take plain guaifenesin (1200mg  extended release tabs such as Mucinex) twice daily, with plenty of water, for cough and congestion.  May add Pseudoephedrine (30mg , one or two every 4 to 6 hours) for sinus congestion.  Get adequate rest.   May take Delsym Cough Suppressant at bedtime for nighttime cough.  Try warm salt water gargles for sore throat.  Stop all antihistamines for now, and other non-prescription cough/cold preparations.  Recommend a Tdap when well.     ED Prescriptions  Medication Sig Dispense Auth. Provider   albuterol (VENTOLIN HFA) 108 (90 Base) MCG/ACT inhaler Inhale 1-2 puffs into the lungs every 4 (four) hours as needed for wheezing or shortness of breath. 8 g Lattie Haw, MD   azithromycin (ZITHROMAX Z-PAK) 250 MG tablet Take 2 tabs today; then begin one tab once daily for 4 more days. 6 tablet Lattie Haw, MD   predniSONE (DELTASONE) 20 MG tablet Take one tab by mouth twice daily for 4 days, then one daily for 3 days. Take with food. 11 tablet Lattie Haw, MD        Lattie Haw, MD 05/10/19 2203

## 2019-05-10 DIAGNOSIS — F331 Major depressive disorder, recurrent, moderate: Secondary | ICD-10-CM | POA: Diagnosis not present

## 2019-05-17 DIAGNOSIS — F331 Major depressive disorder, recurrent, moderate: Secondary | ICD-10-CM | POA: Diagnosis not present

## 2019-05-23 ENCOUNTER — Encounter: Payer: Self-pay | Admitting: Adult Health

## 2019-05-23 ENCOUNTER — Other Ambulatory Visit: Payer: Self-pay

## 2019-05-23 ENCOUNTER — Ambulatory Visit (INDEPENDENT_AMBULATORY_CARE_PROVIDER_SITE_OTHER): Payer: 59 | Admitting: Adult Health

## 2019-05-23 DIAGNOSIS — F331 Major depressive disorder, recurrent, moderate: Secondary | ICD-10-CM | POA: Diagnosis not present

## 2019-05-23 DIAGNOSIS — F410 Panic disorder [episodic paroxysmal anxiety] without agoraphobia: Secondary | ICD-10-CM | POA: Diagnosis not present

## 2019-05-23 DIAGNOSIS — F902 Attention-deficit hyperactivity disorder, combined type: Secondary | ICD-10-CM

## 2019-05-23 DIAGNOSIS — F411 Generalized anxiety disorder: Secondary | ICD-10-CM

## 2019-05-23 DIAGNOSIS — G4700 Insomnia, unspecified: Secondary | ICD-10-CM

## 2019-05-23 MED ORDER — ALPRAZOLAM 0.5 MG PO TABS: 0.5000 mg | ORAL_TABLET | Freq: Three times a day (TID) | ORAL | 0 refills | Status: DC | PRN

## 2019-05-23 NOTE — Progress Notes (Signed)
[facility] MD/PA/NP Medication Check  05/23/2019 5:23 PM Nicholas Ortega  MRN:  [id number]  Chief Complaint:   HPI:   Describes mood today as "so-so". Pleasant. Mood symptoms - reports decreased depression and irritability. Feels like depression is "better" with increase in Abilify. Stating "my anxiety is through the roof". Increased anxiety and panic attacks since last visit. 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 Reports constant stress from his ex - "she pushes me all the time". Anniversary of best friend's death coming up - "he killed himself". Seeing therapist twice a week and they have discussed Xanax as an option to control anxierty. Denies alcohol and THC use. Stating "the best thing I have going right now are my children". Shares custody with ex. Stable interest and motivation. Taking medications as prescribed.  Energy levels stable. Active, has a regular exercise routine - daily. Walking. 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  Enjoys some usual interests and activities. Spending time with family - 2 children (8 and 5) and 1 dog.  Appetite adequate. Weight loss - 40 pounds over past 8 weeks.  Sleeps well most nights. Averages 5 to 6 hours. Wakes up at odd hours and can't get back to sleep some nights.  Focus and concentration stable. Completing tasks. Managing aspects of household. 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  Denies SI or HI. Denies AH or VH.  Previous medications: Ritalin, Wellbutrin, Lexapro - SSE  Visit Diagnosis:    ICD-10-CM   1. Insomnia, unspecified type  G47.00   2. Panic attacks  F41.0 ALPRAZolam (XANAX) 0.5 MG tablet  3. Major depressive disorder, recurrent episode, moderate (HCC)  F33.1   4. Generalized anxiety disorder  F41.1 ALPRAZolam (XANAX) 0.5 MG tablet  5. Attention deficit hyperactivity disorder (ADHD), combined type  F90.2     Past Psychiatric History: Denies  Past Medical History: No past medical history  on file. No past surgical history on file.  Family Psychiatric History: Both parents - depression.  Family History:  Family History  Problem Relation Age of Onset  . Transient ischemic attack Father     Social History:  Social History   Socioeconomic History  . Marital status: Married    Spouse name: Not on file  . Number of children: Not on file  . Years of education: Not on file  . Highest education level: Not on file  Occupational History  . Not on file  Social Needs  . Financial resource strain: Not on file  . Food insecurity    Worry: Not on file    Inability: Not on file  . Transportation needs    Medical: Not on file    Non-medical: Not on file  Tobacco Use  . Smoking status: Former Research scientist (life sciences)  . Smokeless tobacco: Never Used  Substance and Sexual Activity  . Alcohol use: Yes    Comment: occ  . Drug use: No  . Sexual activity: Not on file  Lifestyle  . Physical activity    Days per week: Not on file    Minutes per session: Not on file  . Stress: Not on file  Relationships  . Social Herbalist on phone: Not on file    Gets together: Not on file    Attends religious service: Not on file    Active member of club or organization: Not on file    Attends meetings of clubs or organizations:  Not on file    Relationship status: Not on file  Other Topics Concern  . Not on file  Social History Narrative  . Not on file    Allergies:  Allergies  Allergen Reactions  . Hydrocodone Palpitations    Metabolic Disorder Labs: No results found for: HGBA1C, MPG No results found for: PROLACTIN No results found for: CHOL, TRIG, HDL, CHOLHDL, VLDL, LDLCALC No results found for: TSH  Therapeutic Level Labs: No results found for: LITHIUM No results found for: VALPROATE No components found for:  CBMZ  Current Medications: Current Outpatient Medications  Medication Sig Dispense Refill  . albuterol (VENTOLIN HFA) 108 (90 Base) MCG/ACT inhaler Inhale 1-2  puffs into the lungs every 4 (four) hours as needed for wheezing or shortness of breath. 8 g 0  . ALPRAZolam (XANAX) 0.5 MG tablet Take 1 tablet (0.5 mg total) by mouth 3 (three) times daily as needed for anxiety. 90 tablet 0  . ARIPiprazole (ABILIFY) 10 MG tablet Take 1 tablet (10 mg total) by mouth daily. 90 tablet 1  . azithromycin (ZITHROMAX Z-PAK) 250 MG tablet Take 2 tabs today; then begin one tab once daily for 4 more days. 6 tablet 0  . methocarbamol (ROBAXIN) 500 MG tablet Take 1 tablet (500 mg total) by mouth 2 (two) times daily. (Patient not taking: Reported on 02/28/2019) 20 tablet 0  . predniSONE (DELTASONE) 20 MG tablet Take one tab by mouth twice daily for 4 days, then one daily for 3 days. Take with food. 11 tablet 0  . traMADol (ULTRAM) 50 MG tablet Take 1 tablet (50 mg total) by mouth every 6 (six) hours as needed. (Patient not taking: Reported on 02/28/2019) 15 tablet 0   No current facility-administered medications for this visit.     Medication Side Effects: none  Orders placed this visit:  No orders of the defined types were placed in this encounter.   Psychiatric Specialty Exam:  Review of Systems  Musculoskeletal: Negative for falls.  Neurological: Negative for tremors and weakness.  Psychiatric/Behavioral: Negative for depression. The patient is nervous/anxious. The patient does not have insomnia.     There were no vitals taken for this visit.There is no height or weight on file to calculate BMI.  General Appearance: Neat and Well Groomed  Eye Contact:  Good  Speech:  Normal Rate  Volume:  Normal  Mood:  Anxious and Panic attacks   Affect:  Appropriate  Thought Process:  Coherent and Descriptions of Associations: Intact  Orientation:  Full (Time, Place, and Person)  Thought Content: Logical   Suicidal Thoughts:  No  Homicidal Thoughts:  No  Memory:  WNL  Judgement:  Good  Insight:  Good  Psychomotor Activity:  Negative  Concentration:  Concentration: Good   Recall:  NA  Fund of Knowledge: Good  Language: Good  Assets:  Communication Skills Desire for Improvement Financial Resources/Insurance Housing Intimacy Leisure Time Physical Health Resilience Social Support Talents/Skills Transportation Vocational/Educational  ADL's:  Intact  Cognition: WNL  Prognosis:  Good   Screenings: None   Receiving Psychotherapy: No   Treatment Plan/Recommendations:   Plan:  Abilify 10mg  daily Add Xanax 0.5mg  TID prn anxiety and panic attacks D/C Hydroxyzine 25mg  - 4 x daily  D/C Buspar TID   Denies ETOH and THC use   RTC 4 weeks  Patient advised to contact office with any questions, adverse effects, or acute worsening in signs and symptoms.  Discussed potential metabolic side effects associated with atypical antipsychotics,  as well as potential risk for movement side effects. Advised pt to contact office if movement side effects occur.     Dorothyann Gibbsegina N Tania Steinhauser, NP

## 2019-05-24 DIAGNOSIS — F331 Major depressive disorder, recurrent, moderate: Secondary | ICD-10-CM | POA: Diagnosis not present

## 2019-05-26 MED FILL — busPIRone HCL 10 MG TABS: 10 | 30 days supply | Qty: 90 | Fill #0

## 2019-05-30 ENCOUNTER — Ambulatory Visit: Payer: 59 | Admitting: Adult Health

## 2019-05-30 DIAGNOSIS — F331 Major depressive disorder, recurrent, moderate: Secondary | ICD-10-CM | POA: Diagnosis not present

## 2019-06-06 DIAGNOSIS — F331 Major depressive disorder, recurrent, moderate: Secondary | ICD-10-CM | POA: Diagnosis not present

## 2019-06-13 DIAGNOSIS — F331 Major depressive disorder, recurrent, moderate: Secondary | ICD-10-CM | POA: Diagnosis not present

## 2019-06-16 ENCOUNTER — Other Ambulatory Visit: Payer: Self-pay

## 2019-06-16 ENCOUNTER — Emergency Department (HOSPITAL_COMMUNITY)
Admission: EM | Admit: 2019-06-16 | Discharge: 2019-06-17 | Disposition: A | Discharge status: Home / Self Care | Payer: PRIVATE HEALTH INSURANCE | Attending: Emergency Medicine | Admitting: Emergency Medicine

## 2019-06-16 ENCOUNTER — Emergency Department (HOSPITAL_COMMUNITY): Payer: PRIVATE HEALTH INSURANCE

## 2019-06-16 DIAGNOSIS — Z87891 Personal history of nicotine dependence: Secondary | ICD-10-CM | POA: Insufficient documentation

## 2019-06-16 DIAGNOSIS — M25511 Pain in right shoulder: Secondary | ICD-10-CM | POA: Insufficient documentation

## 2019-06-16 DIAGNOSIS — S4991XA Unspecified injury of right shoulder and upper arm, initial encounter: Secondary | ICD-10-CM | POA: Diagnosis not present

## 2019-06-16 NOTE — ED Notes (Signed)
Patient transported to X-ray 

## 2019-06-16 NOTE — ED Triage Notes (Signed)
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 Limited ROM.

## 2019-06-17 DIAGNOSIS — S4991XA Unspecified injury of right shoulder and upper arm, initial encounter: Secondary | ICD-10-CM | POA: Diagnosis not present

## 2019-06-17 DIAGNOSIS — M25511 Pain in right shoulder: Secondary | ICD-10-CM | POA: Diagnosis not present

## 2019-06-17 NOTE — Discharge Instructions (Addendum)
Thank you for allowing me to care for you today in the Emergency Department.   Take 650 mg of Tylenol or 600 mg of ibuprofen with food every 6 hours for pain.  You can alternate between these 2 medications every 3 hours if your pain returns.  For instance, you can take Tylenol at noon, followed by a dose of ibuprofen at 3, followed by second dose of Tylenol and 6.  Apply ice pack to areas that are sore for 15 to 20 minutes as frequently as needed.  Start to stretch the muscles of your shoulder as your pain allows.  You can follow-up with sports medicine and orthopedics if your symptoms do not significantly improve in the next week.  Return the emergency department if you develop new numbness, weakness, fevers, chills, the joint gets red, hot to the touch, swollen, or if you develop other new, concerning symptoms.

## 2019-06-17 NOTE — ED Provider Notes (Signed)
[facility] EMERGENCY DEPARTMENT Provider Note   CSN: [id number] Arrival date & time: 06/16/19  2332     History Chief Complaint  Patient presents with  . Shoulder Pain    Nicholas Ortega is a 38 y.o. male with no significant past medical history who presents to the emergency department with a chief complaint of right shoulder pain that began just prior to arrival while he was at work.  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  He reports history of similar pain several years ago secondary to a similar mechanism, but he never followed up with primary care or orthopedics.  He denies weakness, numbness, right elbow or wrist pain, neck pain, fever, or chills.  No treatment prior to arrival.  The history is provided by the patient. No language interpreter was used.       No past medical history on file.  There are no problems to display for this patient.   No past surgical history on file.     Family History  Problem Relation Age of Onset  . Transient ischemic attack Father     Social History   Tobacco Use  . Smoking status: Former Games developer  . Smokeless tobacco: Never Used  Substance Use Topics  . Alcohol use: Yes    Comment: occ  . Drug use: No    Home Medications Prior to Admission medications   Medication Sig Start Date End Date Taking? Authorizing Provider  albuterol (VENTOLIN HFA) 108 (90 Base) MCG/ACT inhaler Inhale 1-2 puffs into the lungs every 4 (four) hours as needed for wheezing or shortness of breath. 05/07/19   Lattie Haw, MD  ALPRAZolam Prudy Feeler) 0.5 MG tablet Take 1 tablet (0.5 mg total) by mouth 3 (three) times daily as needed for anxiety. 05/23/19   Mozingo, Thereasa Solo, NP  ARIPiprazole (ABILIFY) 10 MG tablet Take 1 tablet (10 mg total) by mouth daily. 05/02/19   Mozingo, Thereasa Solo, NP  azithromycin (ZITHROMAX Z-PAK) 250 MG tablet Take  2 tabs today; then begin one tab once daily for 4 more days. 05/07/19   Lattie Haw, MD  predniSONE (DELTASONE) 20 MG tablet Take one tab by mouth twice daily for 4 days, then one daily for 3 days. Take with food. 05/07/19   Lattie Haw, MD    Allergies    Hydrocodone  Review of Systems   Review of Systems  Constitutional: Negative for activity change, chills and fever.  Respiratory: Negative for shortness of breath.   Cardiovascular: Negative for chest pain.  Gastrointestinal: Negative for abdominal pain.  Musculoskeletal: Positive for arthralgias and myalgias. Negative for back pain, gait problem, joint swelling, neck pain and neck stiffness.  Skin: Negative for rash.  Neurological: Negative for seizures, syncope, weakness and numbness.    Physical Exam Updated Vital Signs BP (!) 143/105   Pulse 77   Temp 97.8 F (36.6 C) (Oral)   Resp 20   Ht 6\' 1"  (1.854 m)   Wt (!) 158.8 kg   SpO2 99%   BMI 46.18 kg/m   Physical Exam Vitals and nursing note reviewed.  Constitutional:      Appearance: He is well-developed.  HENT:     Head: Normocephalic.  Eyes:     Conjunctiva/sclera: Conjunctivae normal.  Cardiovascular:     Rate and Rhythm: Normal rate and regular rhythm.  Heart sounds: No murmur.  Pulmonary:     Effort: Pulmonary effort is normal.  Abdominal:     General: There is no distension.     Palpations: Abdomen is soft.  Musculoskeletal:     Cervical back: Neck supple.     Comments: Full active and passive range of motion of the right shoulder, elbow, and wrist.  No tenderness over the clavicle.  Radial pulses are 2+ and symmetric.  Sensation is intact and equal throughout the bilateral upper and lower extremities.  No overlying redness, warmth to the right shoulder.  Normal exam of the cervical spine without tenderness.  He is tender to palpation over the insertion of the biceps tendon.  Negative Apley scratch test.  Negative empty can test.  Skin:     General: Skin is warm and dry.  Neurological:     Mental Status: He is alert.  Psychiatric:        Behavior: Behavior normal.     ED Results / Procedures / Treatments   Labs (all labs ordered are listed, but only abnormal results are displayed) Labs Reviewed - No data to display  EKG None  Radiology DG Shoulder Right  Result Date: 06/17/2019 CLINICAL DATA:  Pain.  Injury. EXAM: RIGHT SHOULDER - 2+ VIEW COMPARISON:  None. FINDINGS: There is no evidence of fracture or dislocation. There is no evidence of arthropathy or other focal bone abnormality. Soft tissues are unremarkable. IMPRESSION: Negative. Electronically Signed   By: Constance Holster M.D.   On: 06/17/2019 00:25    Procedures Procedures (including critical care time)  Medications Ordered in ED Medications - No data to display  ED Course  I have reviewed the triage vital signs and the nursing notes.  Pertinent labs & imaging results that were available during my care of the patient were reviewed by me and considered in my medical decision making (see chart for details).    MDM Rules/Calculators/A&P                      Patient X-Ray negative for obvious fracture or dislocation.  Patient declined pain management in the ED.  Suspect biceps tendinitis.  Advised to follow up with orthopedics if symptoms persist. RICE therapy recommended and discussed. Patient will be dc home & is agreeable with above plan.  Final Clinical Impression(s) / ED Diagnoses Final diagnoses:  Acute pain of right shoulder    Rx / DC Orders ED Discharge Orders    None       Joanne Gavel, PA-C 06/17/19 0138    Ripley Fraise, MD 06/17/19 [phone]

## 2019-06-20 ENCOUNTER — Encounter: Payer: Self-pay | Admitting: Adult Health

## 2019-06-20 ENCOUNTER — Ambulatory Visit (INDEPENDENT_AMBULATORY_CARE_PROVIDER_SITE_OTHER): Payer: 59 | Admitting: Adult Health

## 2019-06-20 DIAGNOSIS — F411 Generalized anxiety disorder: Secondary | ICD-10-CM

## 2019-06-20 DIAGNOSIS — G4700 Insomnia, unspecified: Secondary | ICD-10-CM

## 2019-06-20 DIAGNOSIS — F902 Attention-deficit hyperactivity disorder, combined type: Secondary | ICD-10-CM

## 2019-06-20 DIAGNOSIS — F331 Major depressive disorder, recurrent, moderate: Secondary | ICD-10-CM

## 2019-06-20 DIAGNOSIS — F410 Panic disorder [episodic paroxysmal anxiety] without agoraphobia: Secondary | ICD-10-CM

## 2019-06-20 MED ORDER — ALPRAZOLAM 0.5 MG PO TABS: 0.5000 mg | ORAL_TABLET | Freq: Three times a day (TID) | ORAL | 2 refills | Status: DC | PRN
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
## 2019-06-27 DIAGNOSIS — F331 Major depressive disorder, recurrent, moderate: Secondary | ICD-10-CM | POA: Diagnosis not present

## 2019-07-04 DIAGNOSIS — F331 Major depressive disorder, recurrent, moderate: Secondary | ICD-10-CM | POA: Diagnosis not present

## 2019-07-11 DIAGNOSIS — F331 Major depressive disorder, recurrent, moderate: Secondary | ICD-10-CM | POA: Diagnosis not present

## 2019-07-25 DIAGNOSIS — F331 Major depressive disorder, recurrent, moderate: Secondary | ICD-10-CM | POA: Diagnosis not present

## 2019-08-01 DIAGNOSIS — F331 Major depressive disorder, recurrent, moderate: Secondary | ICD-10-CM | POA: Diagnosis not present

## 2019-08-02 ENCOUNTER — Ambulatory Visit (INDEPENDENT_AMBULATORY_CARE_PROVIDER_SITE_OTHER): Payer: 59 | Admitting: Osteopathic Medicine

## 2019-08-02 ENCOUNTER — Encounter: Payer: Self-pay | Admitting: Osteopathic Medicine

## 2019-08-02 DIAGNOSIS — F39 Unspecified mood [affective] disorder: Secondary | ICD-10-CM

## 2019-08-02 NOTE — Progress Notes (Signed)
Virtual Visit via Video (App used: dximity) Note  I connected with      Nicholas Ortega on 08/02/19 at 5:06 PM  by a telemedicine application and verified that I am speaking with the correct person using two identifiers.  Patient is at home I am in office   I discussed the limitations of evaluation and management by telemedicine and the availability of in person appointments. The patient expressed understanding and agreed to proceed.  History of Present Illness: Nicholas Ortega is a 39 y.o. male who would like to discuss establish care, weight  Very pleasant patient here to establish care.  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 in [employer].  History of ADHD, bipolar disorder, he is following with psychiatry and reports moods have been doing really well and he is really well controlled on Abilify and occasional use of alprazolam.  He is interested in losing weight.  He has been able to lose 40 pounds on his own, but is still obese and seems to have plateaued even with optimization of diet/exercise.    Social History   Tobacco Use  Smoking Status Current Some Day Smoker  . Types: Cigars  Smokeless Tobacco Never Used  Quit cigarettes a year ago      Observations/Objective: Ht 6\' 1"  (1.854 m)   Wt (!) 350 lb (158.8 kg)   BMI 46.18 kg/m  BP Readings from Last 3 Encounters:  06/16/19 (!) 143/105  05/07/19 137/85  02/28/19 (!) 149/33   Exam: Normal Speech.  NAD  Lab and Radiology Results No results found for this or any previous visit (from the past 72 hour(s)). No results found.     Assessment and Plan: 39 y.o. male with The primary encounter diagnosis was Morbid obesity (HCC). 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  Discussed options for medication management of weight/appetite.  He may be a good candidate for Qsymia or Contrave, possibly for Saxenda or Ozempic depending on his labs.  I do have some concerns  given his mental health history, before starting any medications would probably recommend reaching out to his psychiatric team.  I think medical weight management clinic would be helpful here, patient would like referral as he is possibly interested in bariatric surgery but has a few questions about his options. Will get labs baseline and go from there!    PDMP not reviewed this encounter. Orders Placed This Encounter  Procedures  . CBC  . COMPLETE METABOLIC PANEL WITH GFR  . Lipid panel  . TSH  . Hemoglobin A1c  . Amb Ref to Medical Weight Management    Referral Priority:   Routine    Referral Type:   Consultation    Referral Reason:   Specialty Services Required    Number of Visits Requested:   1   No orders of the defined types were placed in this encounter.  Patient Instructions  Plan:  We will get baseline labs, orders are in for you to come to the lab at your convenience.  We will reach out to you once I have results, if you have MyChart you may see the results before I review them, any critical abnormalities will be brought to your attention ASAP, otherwise give me 1 to 2 days to make any comments on minor abnormalities which may come up.   I have placed a referral to the medical weight management clinic in  [place].  Someone should be reaching out to you to set up an appointment.  I think they will be helpful to consult with, particularly with your other medications/mental health history we want to be careful if we are going to consider certain weight loss medications.  They may also be able to answer your questions regarding bariatric surgery procedures a bit better than I could answer those questions.  Let me know if you do not hear back about a referral!  See below for general guidelines for diet/exercise for weight loss, most of this you probably know already but sometimes it helps to kind of look at all of it in one place.  See the bottom of the list, these are the  medications that we commonly prescribed for appetite suppression/obesity management.    Weight loss: important things to remember  It is hard work! You will have setbacks, but don't get discouraged. The goal is not short-term success, it is long-term health.   Looking at the numbers is important to track your progress and set goals, but how you are feeling and your overall health are the most important things! BMI and pounds and calories and miles logged aren't everything - they are tools to help [place] reach your goals.  You can do this!!!   Things to remember for exercise for weight loss:   Please note - I am not a certified personal trainer. I can present you with ideas and general workout goals, but an exercise program is largely up to you. Find something you can stick with, and something you enjoy!   As you progress in your exercise regimen think about gradually increasing the following, week by week:   intensity (how strenuous is your workout)  frequency (how often you are exercising)  duration (how many minutes at a time you are exercising)  Walking for 20 minutes a day is certainly better than nothing, but more strenuous exercise will develop better cardiovascular fitness.   interval training (high-intensity alternating with low-intensity, think walk/jog rather than just walk)  muscle strengthening exercises (weight lifting, calisthenics, yoga) - this also helps prevent osteoporosis!   Things to remember for diet changes for weight loss:   Please note - I am not a certified dietician. I can present you with ideas and general diet goals, but a meal plan is largely up to you. I am happy to refer you to a dietician who can give you a detailed meal plan.  Apps/logs can be helpful to track how you're eating! It's not realistic to be logging everything you eat forever, but when you're starting a healthy eating lifestyle it's very helpful, and checking in with logs now and then may  help you stick to your program!   Calorie restriction with the goal weight loss of no more than one to one and a half pounds per week.   Increase lean protein such as chicken, fish, [place].   Decrease fatty foods such as dairy, butter.   Decrease sugary foods. Avoid sugary drinks such as soda or juice.  Increase fiber found in fruit and vegetables.   Medications approved for long-term use for obesity  Qsymia (Phentermine and Topiramate)  Saxenda (Liraglutide daily injectible), Ozempic (Semaglutide weekly injectible)   Contrave (Bupropion and Naltrexone)  Orlistat (Xenical, Alli)  Bupropion (Wellbutrin) I recommend that you research the above medications and see which one(s) your insurance may or may not cover: If you call your insurance, ask them specifically what medications are on their  formulary that are approved for obesity treatment. They should be able to send you a list or tell you over the phone. Remember, medications aren't magic! You MUST be diligent about lifestyle changes as well!   Return for recheck pending lab results! Annual check-up when due.     Instructions sent via MyChart. If MyChart not available, pt was given option for info via personal e-mail w/ no guarantee of protected health info over unsecured e-mail communication, and MyChart sign-up instructions were sent to patient.   Follow Up Instructions: Return for recheck pending lab results! Annual check-up when due.    I discussed the assessment and treatment plan with the patient. The patient was provided an opportunity to ask questions and all were answered. The patient agreed with the plan and demonstrated an understanding of the instructions.   The patient was advised to call back or seek an in-person evaluation if any new concerns, if symptoms worsen or if the condition fails to improve as anticipated.  35 minutes of non-face-to-face time was provided during this  encounter.      . . . . . . . . . . . . . Marland Kitchen                   Historical information moved to improve visibility of documentation.  Past Medical History:  Diagnosis Date  . Alcohol addiction (HCC)   . Alcohol use   . Anxiety   . Depression   . Drug addiction in remission (HCC)   . Family history of depression   . History of drug use   . HTN (hypertension)    History reviewed. No pertinent surgical history. Social History   Tobacco Use  . Smoking status: Current Some Day Smoker    Types: Cigars  . Smokeless tobacco: Never Used  Substance Use Topics  . Alcohol use: Yes    Alcohol/week: 8.0 - 10.0 standard drinks    Types: 8 - 10 Standard drinks or equivalent per week    Comment: occ   family history includes Breast cancer in his paternal grandmother; Depression in his brother, father, and mother; Diabetes in his brother; High blood pressure in his father; Stroke in his father; Transient ischemic attack in his father.  Medications: Current Outpatient Medications  Medication Sig Dispense Refill  . ALPRAZolam (XANAX) 0.5 MG tablet Take 1 tablet (0.5 mg total) by mouth 3 (three) times daily as needed for anxiety. 90 tablet 2  . ARIPiprazole (ABILIFY) 10 MG tablet Take 1 tablet (10 mg total) by mouth daily. 90 tablet 1  . albuterol (VENTOLIN HFA) 108 (90 Base) MCG/ACT inhaler Inhale 1-2 puffs into the lungs every 4 (four) hours as needed for wheezing or shortness of breath. (Patient not taking: Reported on 08/02/2019) 8 g 0  . azithromycin (ZITHROMAX Z-PAK) 250 MG tablet Take 2 tabs today; then begin one tab once daily for 4 more days. (Patient not taking: Reported on 08/02/2019) 6 tablet 0  . predniSONE (DELTASONE) 20 MG tablet Take one tab by mouth twice daily for 4 days, then one daily for 3 days. Take with food. (Patient not taking: Reported on 08/02/2019) 11 tablet 0   No current facility-administered medications for this visit.   Allergies   Allergen Reactions  . Hydrocodone Palpitations

## 2019-08-02 NOTE — Patient Instructions (Addendum)
Plan:  We will get baseline labs, orders are in for you to come to the lab at your convenience.  We will reach out to you once I have results, if you have MyChart you may see the results before I review them, any critical abnormalities will be brought to your attention ASAP, otherwise give me 1 to 2 days to make any comments on minor abnormalities which may come up.   I have placed a referral to the medical weight management clinic in [place].  Someone should be reaching out to you to set up an appointment.  I think they will be helpful to consult with, particularly with your other medications/mental health history we want to be careful if we are going to consider certain weight loss medications.  They may also be able to answer your questions regarding bariatric surgery procedures a bit better than I could answer those questions.  Let me know if you do not hear back about a referral!  See below for general guidelines for diet/exercise for weight loss, most of this you probably know already but sometimes it helps to kind of look at all of it in one place.  See the bottom of the list, these are the medications that we commonly prescribed for appetite suppression/obesity management.    Weight loss: important things to remember  It is hard work! You will have setbacks, but don't get discouraged. The goal is not short-term success, it is long-term health.   Looking at the numbers is important to track your progress and set goals, but how you are feeling and your overall health are the most important things! BMI and pounds and calories and miles logged aren't everything - they are tools to help [place] reach your goals.  You can do this!!!   Things to remember for exercise for weight loss:   Please note - I am not a certified personal trainer. I can present you with ideas and general workout goals, but an exercise program is largely up to you. Find something you can stick with, and something you enjoy!    As you progress in your exercise regimen think about gradually increasing the following, week by week:   intensity (how strenuous is your workout)  frequency (how often you are exercising)  duration (how many minutes at a time you are exercising)  Walking for 20 minutes a day is certainly better than nothing, but more strenuous exercise will develop better cardiovascular fitness.   interval training (high-intensity alternating with low-intensity, think walk/jog rather than just walk)  muscle strengthening exercises (weight lifting, calisthenics, yoga) - this also helps prevent osteoporosis!   Things to remember for diet changes for weight loss:   Please note - I am not a certified dietician. I can present you with ideas and general diet goals, but a meal plan is largely up to you. I am happy to refer you to a dietician who can give you a detailed meal plan.  Apps/logs can be helpful to track how you're eating! It's not realistic to be logging everything you eat forever, but when you're starting a healthy eating lifestyle it's very helpful, and checking in with logs now and then may help you stick to your program!   Calorie restriction with the goal weight loss of no more than one to one and a half pounds per week.   Increase lean protein such as chicken, fish, [place].   Decrease fatty foods such as dairy, butter.   Decrease sugary  foods. Avoid sugary drinks such as soda or juice.  Increase fiber found in fruit and vegetables.   Medications approved for long-term use for obesity  Qsymia (Phentermine and Topiramate)  Saxenda (Liraglutide daily injectible), Ozempic (Semaglutide weekly injectible)   Contrave (Bupropion and Naltrexone)  Orlistat (Xenical, Alli)  Bupropion (Wellbutrin) I recommend that you research the above medications and see which one(s) your insurance may or may not cover: If you call your insurance, ask them specifically what medications are on their  formulary that are approved for obesity treatment. They should be able to send you a list or tell you over the phone. Remember, medications aren't magic! You MUST be diligent about lifestyle changes as well!   Return for recheck pending lab results! Annual check-up when due.

## 2019-08-08 DIAGNOSIS — F331 Major depressive disorder, recurrent, moderate: Secondary | ICD-10-CM | POA: Diagnosis not present

## 2019-08-15 ENCOUNTER — Encounter (INDEPENDENT_AMBULATORY_CARE_PROVIDER_SITE_OTHER): Payer: Self-pay

## 2019-08-15 DIAGNOSIS — F331 Major depressive disorder, recurrent, moderate: Secondary | ICD-10-CM | POA: Diagnosis not present

## 2019-08-17 DIAGNOSIS — F39 Unspecified mood [affective] disorder: Secondary | ICD-10-CM | POA: Diagnosis not present

## 2019-08-18 LAB — COMPLETE METABOLIC PANEL WITH GFR
AG Ratio: 2 (calc) (ref 1.0–2.5)
ALT: 25 U/L (ref 9–46)
AST: 23 U/L (ref 10–40)
Albumin: 4.3 g/dL (ref 3.6–5.1)
Alkaline phosphatase (APISO): 69 U/L (ref 36–130)
BUN: 19 mg/dL (ref 7–25)
CO2: 27 mmol/L (ref 20–32)
Calcium: 9 mg/dL (ref 8.6–10.3)
Chloride: 107 mmol/L (ref 98–110)
Creat: 1 mg/dL (ref 0.60–1.35)
GFR, Est African American: 111 mL/min/{1.73_m2} (ref >=60)
GFR, Est Non African American: 96 mL/min/{1.73_m2} (ref >=60)
Globulin: 2.2 g/dL (calc) (ref 1.9–3.7)
Glucose, Bld: 101 mg/dL — ABNORMAL HIGH (ref 65–99)
Potassium: 4.3 mmol/L (ref 3.5–5.3)
Sodium: 139 mmol/L (ref 135–146)
Total Bilirubin: 0.5 mg/dL (ref 0.2–1.2)
Total Protein: 6.5 g/dL (ref 6.1–8.1)

## 2019-08-18 LAB — CBC
HCT: 47.1 % (ref 38.5–50.0)
Hemoglobin: 15.9 g/dL (ref 13.2–17.1)
MCH: 31.9 pg (ref 27.0–33.0)
MCHC: 33.8 g/dL (ref 32.0–36.0)
MCV: 94.6 fL (ref 80.0–100.0)
MPV: 11.2 fL (ref 7.5–12.5)
Platelets: 204 10*3/uL (ref 140–400)
RBC: 4.98 10*6/uL (ref 4.20–5.80)
RDW: 12.3 % (ref 11.0–15.0)
WBC: 5.5 10*3/uL (ref 3.8–10.8)

## 2019-08-18 LAB — LIPID PANEL
Cholesterol: 223 mg/dL — ABNORMAL HIGH (ref <200)
HDL: 48 mg/dL (ref >=40)
LDL Cholesterol (Calc): 143 mg/dL (calc) — ABNORMAL HIGH
Non-HDL Cholesterol (Calc): 175 mg/dL (calc) — ABNORMAL HIGH (ref <130)
Total CHOL/HDL Ratio: 4.6 (calc) (ref <5.0)
Triglycerides: 182 mg/dL — ABNORMAL HIGH (ref <150)

## 2019-08-18 LAB — HEMOGLOBIN A1C
Hgb A1c MFr Bld: 4.9 % of total Hgb (ref <5.7)
Mean Plasma Glucose: 94 (calc)
eAG (mmol/L): 5.2 (calc)

## 2019-08-18 LAB — TSH: TSH: 1.3 mIU/L (ref 0.40–4.50)

## 2019-08-19 ENCOUNTER — Other Ambulatory Visit: Payer: Self-pay | Admitting: Osteopathic Medicine

## 2019-08-23 ENCOUNTER — Ambulatory Visit (INDEPENDENT_AMBULATORY_CARE_PROVIDER_SITE_OTHER): Payer: 59 | Admitting: Family Medicine

## 2019-08-23 ENCOUNTER — Encounter (INDEPENDENT_AMBULATORY_CARE_PROVIDER_SITE_OTHER): Payer: Self-pay | Admitting: Family Medicine

## 2019-08-23 ENCOUNTER — Other Ambulatory Visit: Payer: Self-pay

## 2019-08-23 VITALS — BP 131/85 | HR 81 | Temp 98.2°F | Ht 73.0 in | Wt 365.0 lb

## 2019-08-23 DIAGNOSIS — Z9189 Other specified personal risk factors, not elsewhere classified: Secondary | ICD-10-CM

## 2019-08-23 DIAGNOSIS — F319 Bipolar disorder, unspecified: Secondary | ICD-10-CM | POA: Diagnosis not present

## 2019-08-23 DIAGNOSIS — R5383 Other fatigue: Secondary | ICD-10-CM

## 2019-08-23 DIAGNOSIS — G4733 Obstructive sleep apnea (adult) (pediatric): Secondary | ICD-10-CM | POA: Diagnosis not present

## 2019-08-23 DIAGNOSIS — I10 Essential (primary) hypertension: Secondary | ICD-10-CM

## 2019-08-23 DIAGNOSIS — F3289 Other specified depressive episodes: Secondary | ICD-10-CM

## 2019-08-23 DIAGNOSIS — R739 Hyperglycemia, unspecified: Secondary | ICD-10-CM

## 2019-08-23 DIAGNOSIS — Z6841 Body Mass Index (BMI) 40.0 and over, adult: Secondary | ICD-10-CM

## 2019-08-23 DIAGNOSIS — Z0289 Encounter for other administrative examinations: Secondary | ICD-10-CM

## 2019-08-23 DIAGNOSIS — R0602 Shortness of breath: Secondary | ICD-10-CM | POA: Diagnosis not present

## 2019-08-23 DIAGNOSIS — E7849 Other hyperlipidemia: Secondary | ICD-10-CM

## 2019-08-23 NOTE — Progress Notes (Signed)
**Note Nicholas Ortega-Identified via Obfuscation** Dear Dr. Sheppard Coil,   Thank you for referring Nicholas Ortega to our clinic. The following note includes my evaluation and treatment recommendations.  Chief Complaint:   OBESITY Nicholas Ortega (MR# [id number]) is a 39 y.o. male who presents for evaluation and treatment of obesity and related comorbidities. Current BMI is Body mass index is 48.16 kg/m. Nicholas Ortega has been struggling with his weight for many years and has been unsuccessful in either losing weight, maintaining weight loss, or reaching his healthy weight goal.  Nicholas Ortega is currently in the action stage of change and ready to dedicate time achieving and maintaining a healthier weight. Nicholas Ortega is interested in becoming our patient and working on intensive lifestyle modifications including (but not limited to) diet and exercise for weight loss.  Nicholas Ortega has lost 30 pounds since November.  He has increased his walking to 5 times per week.  He has been eating a vegetarian diet.  Works the night shift at [employer] habits were reviewed today and are as follows: His family eats meals together, he thinks his family will eat healthier with him, his desired weight loss is 110 pounds, he has been heavy most of his life, he started gaining weight in his 55s, his heaviest weight ever was 390 pounds, he craves sweet and salty foods, he snacks frequently in the evenings, he skips breakfast or dinner frequently, he is trying to follow a vegetarian diet, he frequently makes poor food choices, he frequently eats larger portions than normal and he struggles with emotional eating.  Depression Screen Nicholas Ortega's Food and Mood (modified PHQ-9) score was 15.  Depression screen PHQ 2/9 08/23/2019  Decreased Interest 1  Down, Depressed, Hopeless 2  PHQ - 2 Score 3  Altered sleeping 3  Tired, decreased energy 2  Change in appetite 2  Feeling bad or failure about yourself  3  Trouble concentrating 2  Moving slowly or fidgety/restless 0  Suicidal  thoughts 0  PHQ-9 Score 15  Difficult doing work/chores Not difficult at all   Subjective:   1. Other fatigue Nicholas Ortega denies daytime somnolence and admits to waking up still tired. Patent has a history of symptoms of morning fatigue and snoring. Nicholas Ortega generally gets 6 or 7 hours of sleep per night, and states that he has poor quality sleep. Snoring is present. Apneic episodes are present. Epworth Sleepiness Score is 2.  2. SOB (shortness of breath) on exertion Nicholas Ortega notes increasing shortness of breath with exercising and seems to be worsening over time with weight gain. He notes getting out of breath sooner with activity than he used to. This has gotten worse recently. Hy denies shortness of breath at rest or orthopnea.  3. Bipolar affective disorder, remission status unspecified (Nicholas Ortega) Nicholas Ortega has bipolar disorder, ADHD, anxiety with panic attacks, and insomnia. He sees a therapist and is treated with Abilify.  4. Essential hypertension Review: taking medications as instructed, no medication side effects noted, no chest pain on exertion, no dyspnea on exertion, no swelling of ankles.   BP Readings from Last 3 Encounters:  08/23/19 131/85  06/16/19 (!) 143/105  05/07/19 137/85   5. OSA (obstructive sleep apnea) Nicholas Ortega endorses snoring.  He wore a CPAP machine for many years.  Epworth score was 2.  6. Other hyperlipidemia Nicholas Ortega has hyperlipidemia and has been trying to improve his cholesterol levels with intensive lifestyle modification including a low saturated fat diet, exercise and weight loss. He denies any chest pain, claudication or myalgias.  Lab Results  Component Value Date   ALT 25 08/17/2019   AST 23 08/17/2019   BILITOT 0.5 08/17/2019   Lab Results  Component Value Date   CHOL 223 (H) 08/17/2019   HDL 48 08/17/2019   LDLCALC 143 (H) 08/17/2019   TRIG 182 (H) 08/17/2019   CHOLHDL 4.6 08/17/2019   7. Hyperglycemia Nicholas Ortega has a history of some elevated blood glucose  readings without a diagnosis of diabetes. He denies polyphagia.  8. Other depression, with emotional eating Nicholas Ortega is struggling with emotional eating and using food for comfort to the extent that it is negatively impacting his health. He has been working on behavior modification techniques to help reduce his emotional eating and has been unsuccessful. He shows no sign of suicidal or homicidal ideations.  When in his 11s, Nicholas Ortega would sometimes binge and then purge, but it was never intentional.  PHQ is 15 today.  9. At risk for malnutrition Nicholas Ortega is at increased risk for malnutrition due to his history of bariatric surgery.   Assessment/Plan:   1. Other fatigue Nicholas Ortega does feel that his weight is causing his energy to be lower than it should be. Fatigue may be related to obesity, depression or many other causes. Labs will be ordered, and in the meanwhile, Nicholas Ortega will focus on self care including making healthy food choices, increasing physical activity and focusing on stress reduction.  Orders - EKG 12-Lead - VITAMIN D 25 Hydroxy (Vit-D Deficiency, Fractures) - Vitamin B12  2. SOB (shortness of breath) on exertion Nicholas Ortega does feel that he gets out of breath more easily that he used to when he exercises. Nicholas Ortega's shortness of breath appears to be obesity related and exercise induced. He has agreed to work on weight loss and gradually increase exercise to treat his exercise induced shortness of breath. Will continue to monitor closely.  3. Bipolar affective disorder, remission status unspecified (Nicholas Ortega) We will continue to monitor. Orders and follow up as documented in patient record.  4. Essential hypertension Nicholas Ortega is working on healthy weight loss and exercise to improve blood pressure control. We will watch for signs of hypotension as he continues his lifestyle modifications.  Orders - CBC with Differential/Platelet  5. OSA (obstructive sleep apnea) Intensive lifestyle modifications are the  first line treatment for this issue. We discussed several lifestyle modifications today and he will continue to work on diet, exercise and weight loss efforts. We will continue to monitor. Orders and follow up as documented in patient record.   Counseling  Sleep apnea is a condition in which breathing pauses or becomes shallow during sleep. This happens over and over during the night. This disrupts your sleep and keeps your body from getting the rest that it needs, which can cause tiredness and lack of energy (fatigue) during the day.  Sleep apnea treatment: If you were given a device to open your airway while you sleep, USE IT!  Sleep hygiene:   Limit or avoid alcohol, caffeinated beverages, and cigarettes, especially close to bedtime.   Do not eat a large meal or eat spicy foods right before bedtime. This can lead to digestive discomfort that can make it hard for you to sleep.  Keep a sleep diary to help you and your health care provider figure out what could be causing your insomnia.  . Make your bedroom a dark, comfortable place where it is easy to fall asleep. ? Put up shades or blackout curtains to block light from outside. ? Use  a white noise machine to block noise. ? Keep the temperature cool. . Limit screen use before bedtime. This includes: ? Watching TV. ? Using your smartphone, tablet, or computer. . Stick to a routine that includes going to bed and waking up at the same times every day and night. This can help you fall asleep faster. Consider making a quiet activity, such as reading, part of your nighttime routine. . Try to avoid taking naps during the day so that you sleep better at night. . Get out of bed if you are still awake after 15 minutes of trying to sleep. Keep the lights down, but try reading or doing a quiet activity. When you feel sleepy, go back to bed.  Orders - Ambulatory referral to Neurology  6. Other hyperlipidemia Cardiovascular risk and specific  lipid/LDL goals reviewed.  We discussed several lifestyle modifications today and Breyer will continue to work on diet, exercise and weight loss efforts. Orders and follow up as documented in patient record.   Counseling Intensive lifestyle modifications are the first line treatment for this issue. . Dietary changes: Increase soluble fiber. Decrease simple carbohydrates. . Exercise changes: Moderate to vigorous-intensity aerobic activity 150 minutes per week if tolerated. . Lipid-lowering medications: see documented in medical record.  7. Hyperglycemia Fasting labs will be obtained and results with be discussed with Nicholas Ortega in 2 weeks at his follow up visit. In the meanwhile Nicholas Ortega was started on a lower simple carbohydrate diet and will work on weight loss efforts.  Orders - Comprehensive metabolic panel - Insulin, random  8. Other depression, with emotional eating Patient was referred to Dr. Dewaine Conger, our Bariatric Psychologist, for evaluation due to his elevated PHQ-9 score and significant struggles with emotional eating.  9. At risk for malnutrition Nicholas Ortega was given approximately 15 minutes of counseling today regarding prevention of malnutrition. Nicholas Ortega was advised that having bariatric surgery increases his risk for anemia, malnutrition, and vitamin deficiencies.   10. Class 3 severe obesity with serious comorbidity and body mass index (BMI) of 45.0 to 49.9 in adult, unspecified obesity type Nicholas Ortega) Nicholas Ortega is currently in the action stage of change and his goal is to continue with weight loss efforts. I recommend Nicholas Ortega begin the structured treatment plan as follows:  He has agreed to the Nicholas Ortega.  Exercise goals: No exercise has been prescribed at this time.   Behavioral modification strategies: increasing lean protein intake, decreasing simple carbohydrates, increasing vegetables and increasing water intake.  He was informed of the importance of frequent follow-up visits to  maximize his success with intensive lifestyle modifications for his multiple health conditions. He was informed we would discuss his lab results at his next visit unless there is a critical issue that needs to be addressed sooner. Nicholas Ortega agreed to keep his next visit at the agreed upon time to discuss these results.  Objective:   Blood pressure 131/85, pulse 81, temperature 98.2 F (36.8 C), temperature source Oral, height 6\' 1"  (1.854 m), weight (!) 365 lb (165.6 kg), SpO2 97 %. Body mass index is 48.16 kg/m.  EKG: Normal sinus rhythm, rate 82 bpm.  Indirect Calorimeter completed today shows a VO2 of 396 and a REE of 2753.  His calculated basal metabolic rate is thus his basal metabolic rate is worse than expected.  General: Cooperative, alert, well developed, in no acute distress. HEENT: Conjunctivae and lids unremarkable. Cardiovascular: Regular rhythm.  Lungs: Normal work of breathing. Neurologic: No focal deficits.   Lab Results  Component Value Date   CREATININE 1.00 08/17/2019   BUN 19 08/17/2019   NA 139 08/17/2019   K 4.3 08/17/2019   CL 107 08/17/2019   CO2 27 08/17/2019   Lab Results  Component Value Date   ALT 25 08/17/2019   AST 23 08/17/2019   BILITOT 0.5 08/17/2019   Lab Results  Component Value Date   HGBA1C 4.9 08/17/2019   No results found for: INSULIN Lab Results  Component Value Date   TSH 1.30 08/17/2019   Lab Results  Component Value Date   CHOL 223 (H) 08/17/2019   HDL 48 08/17/2019   LDLCALC 143 (H) 08/17/2019   TRIG 182 (H) 08/17/2019   CHOLHDL 4.6 08/17/2019   Lab Results  Component Value Date   WBC 5.5 08/17/2019   HGB 15.9 08/17/2019   HCT 47.1 08/17/2019   MCV 94.6 08/17/2019   PLT 204 08/17/2019   Attestation Statements:   This is the patient's first visit at [facility]. The patient's NEW PATIENT PACKET was reviewed at length. Included in the packet: current and past health history, medications, allergies,  ROS, gynecologic history (women only), surgical history, family history, social history, weight history, weight loss surgery history (for those that have had weight loss surgery), nutritional evaluation, mood and food questionnaire, PHQ9, Epworth questionnaire, sleep habits questionnaire, patient life and health improvement goals questionnaire. These will all be scanned into the patient's chart under media.   During the visit, I independently reviewed the patient's EKG, bioimpedance scale results, and indirect calorimeter results. I used this information to tailor a meal plan for the patient that will help him to lose weight and will improve his obesity-related conditions going forward. I performed a medically necessary appropriate examination and/or evaluation. I discussed the assessment and treatment plan with the patient. The patient was provided an opportunity to ask questions and all were answered. The patient agreed with the plan and demonstrated an understanding of the instructions. Labs were ordered at this visit and will be reviewed at the next visit unless more critical results need to be addressed immediately. Clinical information was updated and documented in the EMR.   I, Insurance claims handler, CMA, am acting as Energy manager for Ortega. R. Berkley, DO.  I have reviewed the above documentation for accuracy and completeness, and I agree with the above. Helane Rima, DO

## 2019-08-23 NOTE — Progress Notes (Signed)
Office: [phone]  /  Fax: [phone]    Date: August 29, 2019   Appointment Start Time: 9:07am Duration: 53 minutes Provider: Lawerance Cruel, Psy.D. Type of Session: Intake for Individual Therapy  Location of Patient: Home Location of Provider: Provider's Home Type of Contact: Telepsychological Visit via [employer] WebEx  Informed Consent: This provider called Nicholas Ortega at 9:02am as he did not present for the WebEx appointment. The e-mail with the secure link was re-sent. Directions on connecting were provided. As such, today's appointment was initiated 7 minutes late. Prior to proceeding with today's appointment, two pieces of identifying information were obtained. In addition, Nicholas Ortega's physical location at the time of this appointment was obtained as well a phone number he could be reached at in the event of technical difficulties. Nicholas Ortega and this provider participated in today's telepsychological service.   The provider's role was explained to [name]. The provider reviewed and discussed issues of confidentiality, privacy, and limits therein (e.g., reporting obligations). In addition to verbal informed consent, written informed consent for psychological services was obtained prior to the initial appointment. Since the clinic is not a 24/7 crisis center, mental health emergency resources were shared and this  provider explained MyChart, e-mail, voicemail, and/or other messaging systems should be utilized only for non-emergency reasons. This provider also explained that information obtained during appointments will be placed in Nicholas Ortega's medical record and relevant information will be shared with other providers at [facility] for coordination of care. Moreover, Nicholas Ortega agreed information may be shared with other [facility] providers as needed for coordination of care. By signing the service agreement document, Nicholas Ortega provided written consent for coordination of care. Prior to  initiating telepsychological services, Nicholas Ortega completed an informed consent document, which included the development of a safety plan (i.e., an emergency contact, nearest emergency room, and emergency resources) in the event of an emergency/crisis. Nicholas Ortega expressed understanding of the rationale of the safety plan. Nicholas Ortega verbally acknowledged understanding he is ultimately responsible for understanding his insurance benefits for telepsychological and in-person services. This provider also reviewed confidentiality, as it relates to telepsychological services, as well as the rationale for telepsychological services (i.e., to reduce exposure risk to COVID-19). Nicholas Ortega  acknowledged understanding that appointments cannot be recorded without both party consent and he is aware he is responsible for securing confidentiality on his end of the session. Nicholas Ortega verbally consented to proceed.  Chief Complaint/HPI: Nicholas Ortega was referred by Dr. Helane Rima due to other depression, with emotional eating. Per the note for the initial visit with Dr. Helane Rima on August 23, 2019, "Nicholas Ortega is struggling with emotional eating and using food for comfort to the extent that it is negatively impacting his health. He has been working on behavior modification techniques to help reduce his emotional eating and has been unsuccessful. He shows no sign of suicidal or homicidal ideations.  When in his 18s, Nicholas Ortega would sometimes binge and then purge, but it was never intentional.  PHQ is 15 today." The note for the initial appointment with Dr. Helane Rima indicated the following: "His family eats meals together, he thinks his family will eat healthier with him, his desired weight loss is 110 pounds, he has been heavy most of his life, he started gaining weight in his 8s, his heaviest weight ever was 390 pounds, he craves sweet and salty foods, he snacks frequently in the evenings, he skips breakfast or dinner frequently, he is trying to follow  a vegetarian diet, he frequently  makes poor food choices, he frequently eats larger portions than normal and he struggles with emotional eating." Nicholas Ortega's Food and Mood (modified PHQ-9) score on August 23, 2019 was 15.  During today's appointment, Nicholas Ortega was verbally administered a questionnaire assessing various behaviors related to emotional eating. Nicholas Ortega endorsed the following: experience food cravings on a regular basis, eat certain foods when you are anxious, stressed, depressed, or your feelings are hurt, use food to help you cope with emotional situations, find food is comforting to you, overeat when you are worried about something, overeat frequently when you are bored or lonely, not worry about what you eat when you are in a good mood and overeat when you are alone, but eat much less when you are with other people. Nicholas Ortega believes the onset of emotional eating was likely in childhood. He reported there has been a reduction in emotional eating since starting therapy. In addition, Nicholas Ortega endorsed a history of binge eating. He recalled previously not eating and then "eating a ton." He reported over the past 10-15 years, he engaged in binging and purging behaviors weekly. In the past year, Nicholas Ortega noted, "I haven't been binging with the plan to purge."  However, he clarified he last purged last week. This was explored. Nicholas Ortega reported feeling guilty after eating candy resulting in him drinking "a lot of water" and throwing up. He described the frequency of purging as "little to none," noting it has been "a handful of times in the past year."  Moreover, he described the frequency of engaging in binge related behaviors as reduced, noting it was weekly before the past three months. He denied laxative use. Nicholas Ortega denied ever being diagnosed with an eating disorder nor has he been treated for emotional eating. Nicholas Ortega indicated stress, sadness, and boredom triggers emotional/binge eating, whereas exercise makes  emotional/binge eating better. Furthermore, Nicholas Ortega denied other problems of concern; however, described the last two years as "rough" due to a divorce, his best friend dying by suicide, and COVID-19.   Mental Status Examination:  Appearance: well groomed and appropriate hygiene  Behavior: appropriate to circumstances Mood: euthymic Affect: mood congruent Speech: normal in rate, volume, and tone Eye Contact: appropriate Psychomotor Activity: appropriate Gait: unable to assess Thought Process: linear, logical, and goal directed  Thought Content/Perception: denies suicidal and homicidal ideation, plan, and intent and no hallucinations, delusions, bizarre thinking or behavior reported or observed Orientation: time, person, place and purpose of appointment Memory/Concentration: memory, attention, language, and fund of knowledge intact  Insight/Judgment: good  Family & Psychosocial History: Nicholas Ortega reported he is divorced and he has two children (ages 67 and 5). 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 Additionally, Nicholas Ortega shared his highest level of education obtained is a high school diploma. Currently, Herschell's social support system consists of his parents, friends, and co-workers. Moreover, Nicholas Ortega stated he resides with his children.   Medical History:  Past Medical History:  Diagnosis Date   ADD (attention deficit disorder)    Alcohol abuse    Alcohol addiction (HCC)    Alcohol use    Anxiety    Bipolar disorder (manic depression) (HCC)    Depression    Drug addiction in remission [facility])    Family history of depression    History of drug use    HTN (hypertension)    HTN (hypertension)    Obesity    No past surgical history on file. Current Outpatient Medications on File Prior to Visit  Medication Sig Dispense Refill   ALPRAZolam (XANAX) 0.5 MG tablet Take 1 tablet (0.5 mg total) by mouth 3 (three) times daily as needed for anxiety. 90 tablet  2   ARIPiprazole (ABILIFY) 10 MG tablet Take 1 tablet (10 mg total) by mouth daily. 90 tablet 1   No current facility-administered medications on file prior to visit.  Nicholas Ortega denied a history of head injuries and loss of consciousness.   Mental Health History: Nicholas Ortega reported he first attended therapeutic services approximately 4-5 years ago to address depression and anxiety as well as substance use. He indicated he initiated services again in June 2020 due to feeling he was "at the end of the rope" secondary to his divorce, friend's passing, and pandemic. Nicholas Ortega reported he continues to meet with his therapist, Nicholas Ortega, [facility] with [employer] in [place]. He noted they meet every week, noting their next appointment is today. He noted the focus of treatment currently is past events. Aveon reported focus of treatment does not focus on eating concerns. Nicholas Ortega was receptive to inform Nicholas Ortega about meeting with this provider and signing an authorization for coordination of care if deemed necessary. Currently, Nicholas Ortega stated he meets with Yvette Rack, NP for medication management. He noted a plan to schedule a follow-up with her. Ms. Kallie Locks prescribes Abilify and Xanax. Sloane reported there is no history of hospitalizations for psychiatric concerns. Rashard endorsed a family history of mental health related concerns. He reported depression and anxiety on both sides of his family as well as "addiction" on his mother's side. Daoud reported there is no history of trauma including psychological, physical  and sexual abuse, as well as neglect.   Dionel described his typical mood lately as "good," adding he has had an "even mood." Aside from concerns noted above and endorsed on the PHQ-9 and GAD-7, Izen reported experiencing worry thoughts about his family. He also reported a history of manic symptoms (i.e., extremely outgoing, increase in shopping, and decreased need for sleep "for days"). He  stated he last experienced the aforementioned a "few months ago," adding it lasts for "days." He denied engagement in risky behaviors during those times. He also endorsed a history of panic attacks characterized by feeling dread, social withdrawal, and difficulty breathing. He last experienced a panic attack a couple months ago, adding panic attacks trigger the urge to consume alcohol. Dontavious endorsed current alcohol use.  He shared he "struggled with alcoholism in the past" and described himself as being "dependent" in 2019. Currently, Bliss reported he consumes 2-3 bottles of wine weekly. He denied a history of treatment for alcohol use and denied consequences with others; however, reported experiencing concern about his health. He denied drinking and driving. Markies further disclosed marijuana use "couple times" weekly in the form of "1/2 joint" each time." He denied driving under the influence, noting his children are not home when he smokes. He also shared his providers are aware of his marijuana use. Moreover, he disclosed a history of acid, mushroom, and cocaine use. He reported he used cocaine over the span of five years and noted he last used the aforementioned substances approximately 10 years ago. He endorsed tobacco use in the form of couple cigars weekly. Regarding caffeine intake, Shondale reported consuming 3-4 cups coffee daily. Furthermore, Nicholas Ortega indicated he is not experiencing the following: hopelessness, hallucinations and delusions, paranoia, social withdrawal, crying spells and decreased motivation. He also denied current suicidal ideation, plan, and intent; history of and current homicidal ideation,  plan, and intent; and history of and current engagement in self-harm.   Crockett reported he first experienced suicidal ideation in childhood, noting, "It's something I lived with for a very long time." He acknowledged making a suicidal plan (buying a gun), which he noted was approximately five years  ago. Kannan denied ever purchasing a gun and denied a history of suicidal intent. He denied a history of suicide attempts. He last experienced suicidal ideation prior to his prescription of Abilify, noting he was prescribed Abilify in August or September 2020. He added, "Abilify has really changed a lot of things." The following protective factors were identified for Caitlin: children, really good friends, and really good family. If he were to become overwhelmed in the future, which is a sign that a crisis may occur, he identified the following coping skills he could engage in: exercising, walking, taking dog out, going out with kids, cleaning, meal planning, and cooking. It was recommended the aforementioned be written down and developed into a coping card for future reference; he was observed writing. Psychoeducation regarding the importance of reaching out to a trusted individual and/or utilizing emergency resources if there is a change in emotional status and/or there is an inability to ensure safety was provided. Jaymason's confidence in reaching out to a trusted individual and/or utilizing emergency resources should there be an intensification in emotional status and/or there is an inability to ensure safety was assessed on a scale of one to ten where one is not confident and ten is extremely confident. He reported his confidence is a 10. Additionally, Domenico denied current access to firearms and/or weapons. Of note, he endorsed item 9 on the PHQ-9 during a doctor's visit on August 02, 2019. Amiere clarified he was "speaking in general," noting he did not read that the questionnaire was based on the last two weeks.    The following strengths were reported by Nicholas Ortega: pretty compassionate, funny, fun to be around, good friend. The following strengths were observed by this provider: ability to express thoughts and feelings during the therapeutic session, ability to establish and benefit from a therapeutic relationship,  willingness to work toward established goal(s) with the clinic and ability to engage in reciprocal conversation.  Legal History: Barren reported there is no history of legal involvement.   Structured Assessments Results: The Patient Health Questionnaire-9 (PHQ-9) is a self-report measure that assesses symptoms and severity of depression over the course of the last two weeks. Toshio obtained a score of 3 suggesting minimal depression. Krayton finds the endorsed symptoms to be not difficult at all. [0= Not at all; 1= Several days; 2= More than half the days; 3= Nearly every day] Little interest or pleasure in doing things 0  Feeling down, depressed, or hopeless 0  Trouble falling or staying asleep, or sleeping too much 0  Feeling tired or having little energy 0  Poor appetite or overeating 0  Feeling bad about yourself --- or that you are a failure or have let yourself or your family down 3  Trouble concentrating on things, such as reading the newspaper or watching television 0  Moving or speaking so slowly that other people could have noticed? Or the opposite --- being so fidgety or restless that you have been moving around a lot more than usual 0  Thoughts that you would be better off dead or hurting yourself in some way 0  PHQ-9 Score 3    The Generalized Anxiety Disorder-7 (GAD-7) is a brief self-report measure that  assesses symptoms of anxiety over the course of the last two weeks. Tasheem obtained a score of 14 suggesting moderate anxiety. Geremy finds the endorsed symptoms to be somewhat difficult. [0= Not at all; 1= Several days; 2= Over half the days; 3= Nearly every day] Feeling nervous, anxious, on edge 2  Not being able to stop or control worrying 3  Worrying too much about different things 3  Trouble relaxing 2  Being so restless that it's hard to sit still 2  Becoming easily annoyed or irritable 1  Feeling afraid as if something awful might happen 1  GAD-7 Score 14   Interventions:    Conducted a chart review Focused on rapport building Verbally administered PHQ-9 and GAD-7 for symptom monitoring Verbally administered Food & Mood questionnaire to assess various behaviors related to emotional eating. 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 He acknowledged understanding that he may request a follow-up appointment with this provider in the future as long as he is still established with the clinic. Raynell will be sent a handout via e-mail to increase awareness of hunger patterns and subsequent eating. Jeneen Rinks provided verbal consent during today's appointment for this provider to send the handout via e-mail. No further follow-up planned by this provider.

## 2019-08-24 LAB — CBC WITH DIFFERENTIAL/PLATELET
Basophils Absolute: 0 10*3/uL (ref 0.0–0.2)
Basos: 1 %
EOS (ABSOLUTE): 0.1 10*3/uL (ref 0.0–0.4)
Eos: 2 %
Hematocrit: 48.1 % (ref 37.5–51.0)
Hemoglobin: 16.3 g/dL (ref 13.0–17.7)
Immature Grans (Abs): 0 10*3/uL (ref 0.0–0.1)
Immature Granulocytes: 0 %
Lymphocytes Absolute: 1.4 10*3/uL (ref 0.7–3.1)
Lymphs: 21 %
MCH: 32.3 pg (ref 26.6–33.0)
MCHC: 33.9 g/dL (ref 31.5–35.7)
MCV: 95 fL (ref 79–97)
Monocytes Absolute: 0.6 10*3/uL (ref 0.1–0.9)
Monocytes: 8 %
Neutrophils Absolute: 4.7 10*3/uL (ref 1.4–7.0)
Neutrophils: 68 %
Platelets: 236 10*3/uL (ref 150–450)
RBC: 5.04 x10E6/uL (ref 4.14–5.80)
RDW: 12.3 % (ref 11.6–15.4)
WBC: 6.9 10*3/uL (ref 3.4–10.8)

## 2019-08-24 LAB — COMPREHENSIVE METABOLIC PANEL
ALT: 28 IU/L (ref 0–44)
AST: 23 IU/L (ref 0–40)
Albumin/Globulin Ratio: 1.7 (ref 1.2–2.2)
Albumin: 4.4 g/dL (ref 4.0–5.0)
Alkaline Phosphatase: 79 IU/L (ref 39–117)
BUN/Creatinine Ratio: 19 (ref 9–20)
BUN: 18 mg/dL (ref 6–20)
Bilirubin Total: 0.4 mg/dL (ref 0.0–1.2)
CO2: 23 mmol/L (ref 20–29)
Calcium: 9.7 mg/dL (ref 8.7–10.2)
Chloride: 107 mmol/L — ABNORMAL HIGH (ref 96–106)
Creatinine, Ser: 0.93 mg/dL (ref 0.76–1.27)
GFR calc Af Amer: 121 mL/min/{1.73_m2} (ref >59)
GFR calc non Af Amer: 105 mL/min/{1.73_m2} (ref >59)
Globulin, Total: 2.6 g/dL (ref 1.5–4.5)
Glucose: 89 mg/dL (ref 65–99)
Potassium: 4.8 mmol/L (ref 3.5–5.2)
Sodium: 143 mmol/L (ref 134–144)
Total Protein: 7 g/dL (ref 6.0–8.5)

## 2019-08-24 LAB — VITAMIN B12: Vitamin B-12: 384 pg/mL (ref 232–1245)

## 2019-08-24 LAB — INSULIN, RANDOM: INSULIN: 11.7 u[IU]/mL (ref 2.6–24.9)

## 2019-08-24 LAB — VITAMIN D 25 HYDROXY (VIT D DEFICIENCY, FRACTURES): Vit D, 25-Hydroxy: 13.4 ng/mL — ABNORMAL LOW (ref 30.0–100.0)

## 2019-08-29 ENCOUNTER — Ambulatory Visit (INDEPENDENT_AMBULATORY_CARE_PROVIDER_SITE_OTHER): Payer: 59 | Admitting: Psychology

## 2019-08-29 ENCOUNTER — Other Ambulatory Visit: Payer: Self-pay

## 2019-08-29 DIAGNOSIS — F418 Other specified anxiety disorders: Secondary | ICD-10-CM

## 2019-08-29 DIAGNOSIS — F509 Eating disorder, unspecified: Secondary | ICD-10-CM

## 2019-08-30 ENCOUNTER — Other Ambulatory Visit: Payer: Self-pay

## 2019-08-30 DIAGNOSIS — F331 Major depressive disorder, recurrent, moderate: Secondary | ICD-10-CM

## 2019-08-30 DIAGNOSIS — F411 Generalized anxiety disorder: Secondary | ICD-10-CM

## 2019-08-30 DIAGNOSIS — G4700 Insomnia, unspecified: Secondary | ICD-10-CM

## 2019-08-30 DIAGNOSIS — F902 Attention-deficit hyperactivity disorder, combined type: Secondary | ICD-10-CM

## 2019-08-30 MED ORDER — ARIPIPRAZOLE 10 MG PO TABS: 10.0000 mg | ORAL_TABLET | Freq: Every day | ORAL | 1 refills | Status: DC

## 2019-09-05 ENCOUNTER — Encounter: Payer: Self-pay | Admitting: Neurology

## 2019-09-05 ENCOUNTER — Other Ambulatory Visit: Payer: Self-pay

## 2019-09-05 ENCOUNTER — Ambulatory Visit (INDEPENDENT_AMBULATORY_CARE_PROVIDER_SITE_OTHER): Payer: 59 | Admitting: Neurology

## 2019-09-05 VITALS — BP 132/84 | HR 79 | Temp 98.0°F | Ht 73.0 in | Wt 359.0 lb

## 2019-09-05 DIAGNOSIS — R519 Headache, unspecified: Secondary | ICD-10-CM | POA: Diagnosis not present

## 2019-09-05 DIAGNOSIS — G4726 Circadian rhythm sleep disorder, shift work type: Secondary | ICD-10-CM

## 2019-09-05 DIAGNOSIS — Z6841 Body Mass Index (BMI) 40.0 and over, adult: Secondary | ICD-10-CM | POA: Diagnosis not present

## 2019-09-05 DIAGNOSIS — T17308A Unspecified foreign body in larynx causing other injury, initial encounter: Secondary | ICD-10-CM | POA: Insufficient documentation

## 2019-09-05 DIAGNOSIS — R0681 Apnea, not elsewhere classified: Secondary | ICD-10-CM

## 2019-09-05 DIAGNOSIS — G4733 Obstructive sleep apnea (adult) (pediatric): Secondary | ICD-10-CM | POA: Insufficient documentation

## 2019-09-05 DIAGNOSIS — K219 Gastro-esophageal reflux disease without esophagitis: Secondary | ICD-10-CM

## 2019-09-05 NOTE — Patient Instructions (Signed)

## 2019-09-05 NOTE — Progress Notes (Signed)
SLEEP MEDICINE CLINIC    Provider:  Larey Seat, MD  Primary Care Physician:  Emeterio Reeve, [place] [address] [place] [address]     Referring Provider: Dr. Briscoe Deutscher, DO [facility].           Chief Complaint according to patient   Patient presents with:    . New Patient (Initial Visit)     pt states that over 5 yrs ago he had a SS completed. he used a CPAP and stopped. here to reassess apnea and restart      HISTORY OF PRESENT ILLNESS:  Nicholas Ortega is a 39 year- old, left-handed, divorced, overweight Caucasian male patient and  is seen here upon referral on 09/05/2019 from Dr Juleen China.  He  has a past medical history of ADD (attention deficit disorder), Alcohol abuse, Alcohol addiction ([place]), Alcohol use, Anxiety, Bipolar disorder (manic depression) ([place]), Depression, Drug addiction in remission [facility]), Family history of depression, History of drug use, HTN (hypertension), HTN (hypertension), and Obesity. He was an overweight child and teenager-" always been the tallest and biggest kid".    The patient had the first sleep study in the year 2013-14 with a result of an OSA diagnoses, was put on CPAP , loved it ! But it broke in a move from [place] and he ever got it replaced.     Sleep relevant medical history: none .  Family medical /sleep history: father with OSA.   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  Family status is divorced , with 2 children.  The patient currently works in shifts( Presenter, broadcasting,) Wed, Thurs, 7PM  through 7 AM and every  Saturday and Sunday , 12  hour shifts.  Pets are present. 3 cats and 3 dogs.  Tobacco use: quit smoking 07-01-2019 .  ETOH use ; history , not currently ,  Caffeine intake in form of Coffee( daily- and during night shifts.  ) Soda( none) Tea ( hot tea) , but no energy  drinks. Regular exercise in form of walking 1 mile a day.    Hobbies :cooking and gardening.       Sleep habits are as follows: The patient's dinner time on non-working days is between 5-6  PM.  The patient goes to bed at 9.30 PM and continues to sleep for several  hours, wakes between 4-5 hours into sleep( midnight and 2 AM ) and has often trouble to re- initiate sleep. The preferred sleep position is laterally, with the support of 3 pillows. He likes to be propped up, has GERD.  Dreams are reportedly requent/vivid.  Total sleep time averages 6 hours- bedroom is cool, quiet and dark.  5.30  AM is the usual rise time. The patient wakes up spontaneously.  He  reports not always feeling refreshed or restored in AM, with symptoms such as dry mouth, morning headaches, and residual fatigue. Naps are taken infrequently,he doesn't schedule naps-  lasting from 30-60  minutes and are more refreshing than nocturnal sleep. 26/  On a night- shift, he is sleeping in daytime 3-5 hours and provides child care too.    Review of Systems: Out of a complete 14 system review, the patient complains of only the following symptoms, and all other reviewed systems are negative.:  Fatigue, sleepiness ,snoring, fragmented  sleep,  Insomnia - early AM waking.    How likely are you to doze in the following situations: 0 = not likely, 1 = slight chance, 2 = moderate chance, 3 = high chance   Sitting and Reading? Watching Television? Sitting inactive in a public place (theater or meeting)? As a passenger in a car for an hour without a break? Lying down in the afternoon when circumstances permit? Sitting and talking to someone? Sitting quietly after lunch without alcohol? In a car, while stopped for a few minutes in traffic?   Total = 7/ 24 points   FSS endorsed at 26 / 63 points.   Social History   Socioeconomic History  . Marital status: Married    Spouse name: Not on file  . Number of children: Not on file   . Years of education: Not on file  . Highest education level: Not on file  Occupational History  . Occupation: NURSING TECH     Employer: [employer]   Tobacco Use  . Smoking status: Former Smoker    Packs/day: 0.50    Years: 20.00    Pack years: 10.00    Types: Cigars    Quit date: 06/30/2018    Years since quitting: 1.1  . Smokeless tobacco: Never Used  Substance and Sexual Activity  . Alcohol use: Yes    Alcohol/week: 8.0 - 10.0 standard drinks    Types: 8 - 10 Standard drinks or equivalent per week    Comment: occ  . Drug use: Not Currently  . Sexual activity: Yes    Partners: Female, Male    Birth control/protection: Condom  Other Topics Concern  . Not on file  Social History Narrative  . Not on file   Social Determinants of Health   Financial Resource Strain:   . Difficulty of Paying Living Expenses: Not on file  Food Insecurity:   . Worried About Programme researcher, broadcasting/film/video in the Last Year: Not on file  . Ran Out of Food in the Last Year: Not on file  Transportation Needs:   . Lack of Transportation (Medical): Not on file  . Lack of Transportation (Non-Medical): Not on file  Physical Activity:   . Days of Exercise per Week: Not on file  . Minutes of Exercise per Session: Not on file  Stress:   . Feeling of Stress : Not on file  Social Connections:   . Frequency of Communication with Friends and Family: Not on file  . Frequency of Social Gatherings with Friends and Family: Not on file  . Attends Religious Services: Not on file  . Active Member of Clubs or Organizations: Not on file  . Attends Banker Meetings: Not on file  . Marital Status: Not on file    Family History  Problem Relation Age of Onset  . Transient ischemic attack Father   . Depression Father   . High blood pressure Father   . Stroke Father   . Anxiety disorder Father   . Alcoholism Father   . Depression Mother   . Anxiety disorder Mother   . Alcoholism Mother   . Depression  Brother   . Diabetes Brother   . Breast cancer Paternal Grandmother     Past Medical History:  Diagnosis Date  . ADD (attention deficit disorder)   . Alcohol abuse   . Alcohol addiction (HCC)   . Alcohol use   . Anxiety   . Bipolar disorder (manic depression) (HCC)   .  Depression   . Drug addiction in remission (HCC)   . Family history of depression   . History of drug use   . HTN (hypertension)   . HTN (hypertension)   . Obesity     No past surgical history on file.   Current Outpatient Medications on File Prior to Visit  Medication Sig Dispense Refill  . ALPRAZolam (XANAX) 0.5 MG tablet Take 1 tablet (0.5 mg total) by mouth 3 (three) times daily as needed for anxiety. 90 tablet 2  . ARIPiprazole (ABILIFY) 10 MG tablet Take 1 tablet (10 mg total) by mouth daily. 90 tablet 1   No current facility-administered medications on file prior to visit.    Allergies  Allergen Reactions  . Hydrocodone Palpitations    Physical exam:  Today's Vitals   09/05/19 0835  BP: 132/84  Pulse: 79  Temp: 98 F (36.7 C)  Weight: (!) 359 lb (162.8 kg)  Height: 6\' 1"  (1.854 m)   Body mass index is 47.36 kg/m.   Wt Readings from Last 3 Encounters:  09/05/19 (!) 359 lb (162.8 kg)  08/23/19 (!) 365 lb (165.6 kg)  08/02/19 (!) 350 lb (158.8 kg)     Ht Readings from Last 3 Encounters:  09/05/19 6\' 1"  (1.854 m)  08/23/19 6\' 1"  (1.854 m)  08/02/19 6\' 1"  (1.854 m)      General: The patient is awake, alert and appears not in acute distress. The patient has facial hair and multiple tattoos. Head: Normocephalic, atraumatic. Neck is supple. Mallampati 3,  neck circumference: 18.5  inches .  Nasal airflow patent.  Retrognathia is not  seen.  Dental status: intact Cardiovascular:  Regular rate and cardiac rhythm by pulse, without distended neck veins. Respiratory: Lungs are clear to auscultation.  Skin:  Without evidence of ankle edema, or rash. Trunk: The patient's posture is  erect.   Neurologic exam : The patient is awake and alert, oriented to place and time.   Memory subjective described as intact.  Attention span & concentration ability appears normal.  Speech is fluent,  without  dysarthria, dysphonia or aphasia.  Mood and affect are appropriate.   Cranial nerves: no loss of smell or taste reported  Pupils are equal and briskly reactive to light. Funduscopic exam deferred.   Extraocular movements in vertical and horizontal planes were intact and without nystagmus.  No Diplopia. Visual fields by finger perimetry are intact. Hearing was intact to soft voice and finger rubbing.    Facial sensation intact to fine touch.  Facial motor strength is symmetric and tongue and uvula move midline.  Neck ROM : rotation, tilt and flexion extension were normal for age and shoulder shrug was symmetrical.    Motor exam:  Symmetric bulk, tone and ROM.   Normal tone without cog wheeling, symmetric grip strength .   Sensory:  Fine touch, pinprick and vibration were tested  and  normal.  Proprioception tested in the upper extremities was normal.   Coordination: Rapid alternating movements in the fingers/hands were of normal speed.  The Finger-to-nose maneuver was intact without evidence of ataxia, dysmetria or tremor.   Gait and station: Patient could rise unassisted from a seated position, walked without assistive device.  Stance is of normal width/ base .  Toe and heel walk were deferred.  Deep tendon reflexes: in the  upper and lower extremities are symmetrically atenuated  and intact.  Babinski response was deferred .   Dr. 08/25/19 referred Mr. Donney Caraveo  here for a sleep evaluation.  The patient has been eating a vegetarian diet he has lost 30 pounds since November 2020.  He has increased his walking habit, he has been successful with smoking cessation and alcohol cessation.  He works night shifts at [employer] which are troublesome to adhere to a  diet and also to a regular sleep-wake cycle.  His PHQ-9 score was endorsed at 15 points.  Depression may contribute somewhat to a feeling of fatigue, but he has also still endorsed some symptoms that are most likely deconditioning such as shortness of breath on exertion, essential hypertension is still present, he wore a CPAP machine for many years and is in need of a new one as he snores again.  He has also hyperlipidemia, bipolar disorder-ADHD-anxiety he sees a therapist and has been treated with Abilify.  He has developed hypoglycemia which is likely controlling controllable with diet.  Creatinine was 1 BUN was 19 just middle of last month, cholesterol was 223 elevated, his HbA1c here reviewed on 2-17 which is 4.9 which would not indicate any prediabetes.  Normal electrolytes liver function tests and kidney clearance.   Neither fatigue nor excessive daytime sleepiness questionnaires indicate an exorbitant level of either.  Addiction related sleepiness and short sleep time may overlap with his shift work sleep disorder, alcog hol can significantly reduce serotonin. He uses melatonin prn.   I had no access to his original sleep study from [facility] in [place].    After spending a total time of 50 minutes face to face including time for physical and neurologic examination, review of laboratory studies,  personal review of imaging studies, reports and results of other testing and review of referral information / records as far as provided in visit, I have established the following assessments:  1) very likely still has OSA, given BMI, neck size and history . patient would be leaning towards a HST for apnea screening and would like to use it at night time.  2) Bipolar disorder related cyclic insomnia, early waking.  3) HTN is borderline. Not on medications. 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  5) BMI 47 - morbid obesity grade 3  has been addressed by  PCP and weight loss Center and he is doing well on a vegetarian, not vegan, diet.     My Plan is to proceed with:  1)  HST  2) sleep hygiene booklet given and essentials discussed.  3)  He understands that better sleep can help with depression and anxiety symptoms and help with weight loss.   I would like to thank Sunnie Nielsen, DO and Sunnie Nielsen, Do [address] [place],  [place] [address] for allowing me to meet with and to take care of this pleasant patient.   I plan to follow up either personally or through our NP within 2-3 month.   CC: I will share my notes with Dr Helane Rima, DO .  Electronically signed by: Melvyn Novas, MD 09/05/2019 9:13 AM  [facility] Board certified by The [employer] of Sleep Medicine and Diplomate of the [employer] of Sleep Medicine. Board certified In Neurology through the ABPN, Fellow of the [employer] of Neurology. Medical Director of [facility].

## 2019-09-06 ENCOUNTER — Other Ambulatory Visit: Payer: Self-pay

## 2019-09-06 ENCOUNTER — Encounter (INDEPENDENT_AMBULATORY_CARE_PROVIDER_SITE_OTHER): Payer: Self-pay | Admitting: Family Medicine

## 2019-09-06 ENCOUNTER — Ambulatory Visit (INDEPENDENT_AMBULATORY_CARE_PROVIDER_SITE_OTHER): Payer: 59 | Admitting: Family Medicine

## 2019-09-06 VITALS — BP 137/82 | HR 82 | Temp 98.3°F | Ht 73.0 in | Wt 353.0 lb

## 2019-09-06 DIAGNOSIS — E8881 Metabolic syndrome: Secondary | ICD-10-CM | POA: Diagnosis not present

## 2019-09-06 DIAGNOSIS — G4700 Insomnia, unspecified: Secondary | ICD-10-CM

## 2019-09-06 DIAGNOSIS — G4733 Obstructive sleep apnea (adult) (pediatric): Secondary | ICD-10-CM

## 2019-09-06 DIAGNOSIS — Z9189 Other specified personal risk factors, not elsewhere classified: Secondary | ICD-10-CM

## 2019-09-06 DIAGNOSIS — E538 Deficiency of other specified B group vitamins: Secondary | ICD-10-CM | POA: Diagnosis not present

## 2019-09-06 DIAGNOSIS — E7849 Other hyperlipidemia: Secondary | ICD-10-CM | POA: Diagnosis not present

## 2019-09-06 DIAGNOSIS — F319 Bipolar disorder, unspecified: Secondary | ICD-10-CM

## 2019-09-06 DIAGNOSIS — E559 Vitamin D deficiency, unspecified: Secondary | ICD-10-CM

## 2019-09-06 DIAGNOSIS — Z6841 Body Mass Index (BMI) 40.0 and over, adult: Secondary | ICD-10-CM

## 2019-09-06 MED ORDER — VITAMIN D (ERGOCALCIFEROL) 1.25 MG (50000 UNIT) PO CAPS: 50000.0000 [IU] | ORAL_CAPSULE | ORAL | 0 refills | Status: AC

## 2019-09-06 MED ORDER — TRAZODONE HCL 50 MG PO TABS: 50.0000 mg | ORAL_TABLET | Freq: Every day | ORAL | 0 refills | Status: DC

## 2019-09-06 NOTE — Progress Notes (Signed)
Chief Complaint:   OBESITY Nicholas Ortega is here to discuss his progress with his obesity treatment plan along with follow-up of his obesity related diagnoses. Nicholas Ortega is on the [employer] and states he is following his eating plan approximately 40% of the time. Nicholas Ortega states he is walking for 20 minutes 5-6 times per week.  Today's visit was #: 2 Starting weight: 365 lbs Starting date: 08/03/2019 Today's weight: 253 lbs Today's date: 09/06/2019 Total lbs lost to date: 12 lbs Total lbs lost since last in-office visit: 12 lbs  Interim History: Nicholas Ortega is doing well.  He has been eating lots of vegetables.  He has also been journaling.  He says he has been mindful about his choices.  He says he has drank about 4 bottles of wine over the past 2 weeks.  He had a visit with Dr. Vickey Huger, and he will be completing a home sleep test.  Subjective:   1. Vitamin D deficiency Nicholas Ortega's Vitamin D level was 13.4 on 08/23/2019. He is not currently taking vit D. He denies nausea, vomiting or muscle weakness.  2. Other hyperlipidemia Nicholas Ortega has hyperlipidemia and has been trying to improve his cholesterol levels with intensive lifestyle modification including a low saturated fat diet, exercise and weight loss. He denies any chest pain, claudication or myalgias.  Lab Results  Component Value Date   ALT 28 08/23/2019   AST 23 08/23/2019   ALKPHOS 79 08/23/2019   BILITOT 0.4 08/23/2019   Lab Results  Component Value Date   CHOL 223 (H) 08/17/2019   HDL 48 08/17/2019   LDLCALC 143 (H) 08/17/2019   TRIG 182 (H) 08/17/2019   CHOLHDL 4.6 08/17/2019   3. Insulin resistance Love has a diagnosis of insulin resistance based on his elevated fasting insulin level >5. He continues to work on diet and exercise to decrease his risk of diabetes.  Lab Results  Component Value Date   INSULIN 11.7 08/23/2019   Lab Results  Component Value Date   HGBA1C 4.9 08/17/2019   4. Vitamin B12 deficiency He is a  vegetarian.  He does not have a previous diagnosis of pernicious anemia.  He does not have a history of weight loss surgery.   Lab Results  Component Value Date   VITAMINB12 384 08/23/2019   5. OSA (obstructive sleep apnea) [name] has a diagnosis of sleep apnea. He reports that he is not using a CPAP regularly.   6. Insomnia Symptoms include: awakens early.   7. Bipolar affective disorder, remission status unspecified (HCC) Nicholas Ortega is taking Abilify 10 mg daily for his bipolar disorder.  Assessment/Plan:   1. Vitamin D deficiency Low Vitamin D level contributes to fatigue and are associated with obesity, breast, and colon cancer. He agrees to start to take prescription Vitamin D @50 ,000 IU every week and will follow-up for routine testing of Vitamin D, at least 2-3 times per year to avoid over-replacement.  Orders - Vitamin D, Ergocalciferol, (DRISDOL) 1.25 MG (50000 UNIT) CAPS capsule; Take 1 capsule (50,000 Units total) by mouth every 7 (seven) days.  Dispense: 4 capsule; Refill: 0  2. Other hyperlipidemia Cardiovascular risk and specific lipid/LDL goals reviewed.  We discussed several lifestyle modifications today and Nicholas Ortega will continue to work on diet, exercise and weight loss efforts. Orders and follow up as documented in patient record.   Counseling Intensive lifestyle modifications are the first line treatment for this issue. . Dietary changes: Increase soluble fiber. Decrease simple carbohydrates. . Exercise changes:  Moderate to vigorous-intensity aerobic activity 150 minutes per week if tolerated. . Lipid-lowering medications: see documented in medical record.  3. Insulin resistance Nicholas Ortega will continue to work on weight loss, exercise, and decreasing simple carbohydrates to help decrease the risk of diabetes. Nicholas Ortega agreed to follow-up with [place] as directed to closely monitor his progress.  Orders - traZODone (DESYREL) 50 MG tablet; Take 1 tablet (50 mg total) by mouth at  bedtime.  Dispense: 30 tablet; Refill: 0  4. Vitamin B12 deficiency The diagnosis was reviewed with the patient. Counseling provided today, see below. We will continue to monitor. Orders and follow up as documented in patient record.  Counseling . The body needs vitamin B12: to make red blood cells; to make DNA; and to help the nerves work properly so they can carry messages from the brain to the body.  . The main causes of vitamin B12 deficiency include dietary deficiency, digestive diseases, pernicious anemia, and having a surgery in which part of the stomach or small intestine is removed.  . Certain medicines can make it harder for the body to absorb vitamin B12. These medicines include: heartburn medications; some antibiotics; some medications used to treat diabetes, gout, and high cholesterol.  . In some cases, there are no symptoms of this condition. If the condition leads to anemia or nerve damage, various symptoms can occur, such as weakness or fatigue, shortness of breath, and numbness or tingling in your hands and feet.   . Treatment:  o May include taking vitamin B12 supplements.  o Avoid alcohol.  o Eat lots of healthy foods that contain vitamin B12: - Beef, pork, chicken, [place], and organ meats, such as liver.  - Seafood: This includes clams, rainbow trout, salmon, tuna, and haddock. Eggs.  - Cereal and dairy products that are fortified: This means that vitamin B12 has been added to the food.  -  5. OSA (obstructive sleep apnea) Intensive lifestyle modifications are the first line treatment for this issue. We discussed several lifestyle modifications today and he will continue to work on diet, exercise and weight loss efforts. We will continue to monitor. Orders and follow up as documented in patient record.   Counseling  Sleep apnea is a condition in which breathing pauses or becomes shallow during sleep. This happens over and over during the night. This disrupts your sleep and  keeps your body from getting the rest that it needs, which can cause tiredness and lack of energy (fatigue) during the day.  Sleep apnea treatment: If you were given a device to open your airway while you sleep, USE IT!  Sleep hygiene:   Limit or avoid alcohol, caffeinated beverages, and cigarettes, especially close to bedtime.   Do not eat a large meal or eat spicy foods right before bedtime. This can lead to digestive discomfort that can make it hard for you to sleep.  Keep a sleep diary to help you and your health care provider figure out what could be causing your insomnia.  . Make your bedroom a dark, comfortable place where it is easy to fall asleep. ? Put up shades or blackout curtains to block light from outside. ? Use a white noise machine to block noise. ? Keep the temperature cool. . Limit screen use before bedtime. This includes: ? Watching TV. ? Using your smartphone, tablet, or computer. . Stick to a routine that includes going to bed and waking up at the same times every day and night. This can help you  fall asleep faster. Consider making a quiet activity, such as reading, part of your nighttime routine. . Try to avoid taking naps during the day so that you sleep better at night. . Get out of bed if you are still awake after 15 minutes of trying to sleep. Keep the lights down, but try reading or doing a quiet activity. When you feel sleepy, go back to bed.  6. Insomnia Will start the patient on trazodone 50 mg at bedtime.  7. Bipolar affective disorder, remission status unspecified (HCC) Current treatment plan is effective, no change in therapy. We will continue to monitor.  8. At risk for deficient intake of food Nicholas Ortega was given approximately 15 minutes of deficit intake of food prevention counseling today. Nicholas Ortega is at risk for eating too few calories based on current food recall. He was encouraged to focus on meeting caloric and protein goals according to his recommended  meal plan.   9. Class 3 severe obesity with serious comorbidity and body mass index (BMI) of 45.0 to 49.9 in adult, unspecified obesity type [facility]) Nicholas Ortega is currently in the action stage of change. As such, his goal is to continue with weight loss efforts. He has agreed to a plant-based diet and keeping a food journal and adhering to recommended goals of 1500-2000 calories and 95+ grams of protein.   Exercise goals: As is.  Behavioral modification strategies:  Stop wine.  Continue to focus on protein.  Nicholas Ortega has agreed to follow-up with our clinic in 2 weeks. He was informed of the importance of frequent follow-up visits to maximize his success with intensive lifestyle modifications for his multiple health conditions.   Objective:   Blood pressure 137/82, pulse 82, temperature 98.3 F (36.8 C), temperature source Oral, height 6\' 1"  (1.854 m), weight (!) 353 lb (160.1 kg), SpO2 99 %. Body mass index is 46.57 kg/m.  General: Cooperative, alert, well developed, in no acute distress. HEENT: Conjunctivae and lids unremarkable. Cardiovascular: Regular rhythm.  Lungs: Normal work of breathing. Neurologic: No focal deficits.   Lab Results  Component Value Date   CREATININE 0.93 08/23/2019   BUN 18 08/23/2019   NA 143 08/23/2019   K 4.8 08/23/2019   CL 107 (H) 08/23/2019   CO2 23 08/23/2019   Lab Results  Component Value Date   ALT 28 08/23/2019   AST 23 08/23/2019   ALKPHOS 79 08/23/2019   BILITOT 0.4 08/23/2019   Lab Results  Component Value Date   HGBA1C 4.9 08/17/2019   Lab Results  Component Value Date   INSULIN 11.7 08/23/2019   Lab Results  Component Value Date   TSH 1.30 08/17/2019   Lab Results  Component Value Date   CHOL 223 (H) 08/17/2019   HDL 48 08/17/2019   LDLCALC 143 (H) 08/17/2019   TRIG 182 (H) 08/17/2019   CHOLHDL 4.6 08/17/2019   Lab Results  Component Value Date   WBC 6.9 08/23/2019   HGB 16.3 08/23/2019   HCT 48.1 08/23/2019   MCV 95  08/23/2019   PLT 236 08/23/2019   Attestation Statements:   Reviewed by clinician on day of visit: allergies, medications, problem list, medical history, surgical history, family history, social history, and previous encounter notes.  I, 08/25/2019, CMA, am acting as Insurance claims handler for Energy manager, DO.  I have reviewed the above documentation for accuracy and completeness, and I agree with the above. [employer], DO

## 2019-09-15 ENCOUNTER — Encounter (INDEPENDENT_AMBULATORY_CARE_PROVIDER_SITE_OTHER): Payer: Self-pay | Admitting: Family Medicine

## 2019-09-15 NOTE — Telephone Encounter (Signed)
Please advise. Thank you

## 2019-09-20 ENCOUNTER — Other Ambulatory Visit (INDEPENDENT_AMBULATORY_CARE_PROVIDER_SITE_OTHER): Payer: Self-pay | Admitting: *Deleted

## 2019-09-20 DIAGNOSIS — E8881 Metabolic syndrome: Secondary | ICD-10-CM

## 2019-09-20 NOTE — Telephone Encounter (Signed)
Per Dr. Earlene Plater via the [place] message from the patient.

## 2019-09-21 MED ORDER — PHENTERMINE HCL 15 MG PO CAPS: 15.0000 mg | ORAL_CAPSULE | ORAL | 0 refills | Status: DC

## 2019-09-28 ENCOUNTER — Other Ambulatory Visit: Payer: Self-pay

## 2019-09-28 ENCOUNTER — Ambulatory Visit (INDEPENDENT_AMBULATORY_CARE_PROVIDER_SITE_OTHER): Payer: 59 | Admitting: Family Medicine

## 2019-09-28 ENCOUNTER — Encounter (INDEPENDENT_AMBULATORY_CARE_PROVIDER_SITE_OTHER): Payer: Self-pay | Admitting: Family Medicine

## 2019-09-28 VITALS — BP 117/75 | HR 89 | Temp 97.5°F | Ht 73.0 in | Wt 339.0 lb

## 2019-09-28 DIAGNOSIS — E559 Vitamin D deficiency, unspecified: Secondary | ICD-10-CM

## 2019-09-28 DIAGNOSIS — Z6841 Body Mass Index (BMI) 40.0 and over, adult: Secondary | ICD-10-CM

## 2019-09-28 DIAGNOSIS — Z9189 Other specified personal risk factors, not elsewhere classified: Secondary | ICD-10-CM | POA: Diagnosis not present

## 2019-09-28 DIAGNOSIS — R632 Polyphagia: Secondary | ICD-10-CM | POA: Diagnosis not present

## 2019-09-28 DIAGNOSIS — G4700 Insomnia, unspecified: Secondary | ICD-10-CM

## 2019-09-28 DIAGNOSIS — F258 Other schizoaffective disorders: Secondary | ICD-10-CM

## 2019-09-28 MED ORDER — ARIPIPRAZOLE 10 MG PO TABS: 10.0000 mg | ORAL_TABLET | Freq: Every day | ORAL | 0 refills | Status: DC

## 2019-09-28 NOTE — Progress Notes (Signed)
Chief Complaint:   OBESITY Nicholas Ortega is here to discuss his progress with his obesity treatment plan along with follow-up of his obesity related diagnoses. Nicholas Ortega is on keeping a food journal and adhering to recommended goals of 1500-2000 calories and 95+ grams of protein and states he is following his eating plan approximately 80% of the time. Nicholas Ortega states he is lifting weights for 20 minutes 6-7 times per week and walking for 20 minutes 4 times per week.  Today's visit was #: 3 Starting weight: 365 lbs Starting date: 08/03/2019 Today's weight: 339 lbs Today's date: 09/28/2019 Total lbs lost to date: 26 lbs Total lbs lost since last in-office visit: 14 lbs  Interim History: Nicholas Ortega reports that phentermine has helped with appetite and energy.  He is using trazodone 2-3 times a week when he is off work and without kids at home.  Subjective:   1. 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 He works as a with [employer]. He also has custody of his children (ages 73 and 8) during much of the week. Initial visit with Neurology reviewed together. He is happy about plan to get CPAP. We trialed Trazodone after the last visit (using twice per week) and finds it to be very helpful.  2. Nicholas Ortega Rung endorses excessive hunger. We started low-dose phentermine after his last visit and he finds it to be very helpful. No side effects, including psychiatric symptom changes.   Lab Results  Component Value Date   INSULIN 11.7 08/23/2019   Lab Results  Component Value Date   HGBA1C 4.9 08/17/2019   3. Vitamin D deficiency Nicholas Ortega's Vitamin D level was 13.4 on 08/23/2019. He is currently taking prescription vitamin D 50,000 IU each week. He denies nausea, vomiting or muscle weakness.  4. Other schizoaffective disorders (HCC) Nicholas Ortega has the diagnosis of schizoaffective disorder.  He takes Abilify 10 mg daily. This has been well-controlled.  Assessment/Plan:   1. Insomnia The problem of recurrent  insomnia was discussed. Orders and follow up as documented in patient record. Counseling: Intensive lifestyle modifications are the first line treatment for this issue. We discussed several lifestyle modifications today and he will continue to work on diet, exercise and weight loss efforts.   Counseling  Limit or avoid alcohol, caffeinated beverages, and cigarettes, especially close to bedtime.   Do not eat a large meal or eat spicy foods right before bedtime. This can lead to digestive discomfort that can make it hard for you to sleep.  Keep a sleep diary to help you and your health care provider figure out what could be causing your insomnia.  . Make your bedroom a dark, comfortable place where it is easy to fall asleep. ? Put up shades or blackout curtains to block light from outside. ? Use a white noise machine to block noise. ? Keep the temperature cool. . Limit screen use before bedtime. This includes: ? Watching TV. ? Using your smartphone, tablet, or computer. . Stick to a routine that includes going to bed and waking up at the same times every day and night. This can help you fall asleep faster. Consider making a quiet activity, such as reading, part of your nighttime routine. . Try to avoid taking naps during the day so that you sleep better at night. . Get out of bed if you are still awake after 15 minutes of trying to sleep. Keep the lights down, but try reading or doing a quiet activity. When you feel  sleepy, go back to bed.  2. Polyphagia Intensive lifestyle modifications are the first line treatment for this issue. We discussed several lifestyle modifications today and he will continue to work on diet, exercise and weight loss efforts. Orders and follow up as documented in patient record.  Phentermine is helping with polyphagia.  Counseling . Polyphagia is excessive hunger. . Causes can include: low blood sugars, hypERthyroidism, PMS, lack of sleep, stress, insulin resistance,  diabetes, certain medications, and diets that are deficient in protein and fiber.   3. Vitamin D deficiency Low Vitamin D level contributes to fatigue and are associated with obesity, breast, and colon cancer. He agrees to continue to take prescription Vitamin D @50 ,000 IU every week and will follow-up for routine testing of Vitamin D, at least 2-3 times per year to avoid over-replacement.  4. Other schizoaffective disorders ([place]) Will provide courtesy of Abilify today, as below.   - ARIPiprazole (ABILIFY) 10 MG tablet; Take 1 tablet (10 mg total) by mouth daily.  Dispense: 90 tablet; Refill: 0  5. At risk for constipation Nicholas Ortega is at increased risk for constipation due to ichanges in diet and use of phentermine. Nicholas Ortega was given approximately 15 minutes of counseling today regarding prevention of constipation. He was encouraged to increase water and fiber intake.   6. Class 3 severe obesity with serious comorbidity and body mass index (BMI) of 40.0 to 44.9 in adult, unspecified obesity type [facility]) Nicholas Ortega is currently in the action stage of change. As such, his goal is to continue with weight loss efforts. He has agreed to the [place].   Exercise goals: As is.  Behavioral modification strategies: increasing water intake.  Nicholas Ortega has agreed to follow-up with our clinic in 2-3 weeks. He was informed of the importance of frequent follow-up visits to maximize his success with intensive lifestyle modifications for his multiple health conditions.   Objective:   Blood pressure 117/75, pulse 89, temperature (!) 97.5 F (36.4 C), temperature source Oral, height 6\' 1"  (1.854 m), weight (!) 339 lb (153.8 kg), SpO2 97 %. Body mass index is 44.73 kg/m.  General: Cooperative, alert, well developed, in no acute distress. HEENT: Conjunctivae and lids unremarkable. Cardiovascular: Regular rhythm.  Lungs: Normal work of breathing. Neurologic: No focal deficits.   Lab  Results  Component Value Date   CREATININE 0.93 08/23/2019   BUN 18 08/23/2019   NA 143 08/23/2019   K 4.8 08/23/2019   CL 107 (H) 08/23/2019   CO2 23 08/23/2019   Lab Results  Component Value Date   ALT 28 08/23/2019   AST 23 08/23/2019   ALKPHOS 79 08/23/2019   BILITOT 0.4 08/23/2019   Lab Results  Component Value Date   HGBA1C 4.9 08/17/2019   Lab Results  Component Value Date   INSULIN 11.7 08/23/2019   Lab Results  Component Value Date   TSH 1.30 08/17/2019   Lab Results  Component Value Date   CHOL 223 (H) 08/17/2019   HDL 48 08/17/2019   LDLCALC 143 (H) 08/17/2019   TRIG 182 (H) 08/17/2019   CHOLHDL 4.6 08/17/2019   Lab Results  Component Value Date   WBC 6.9 08/23/2019   HGB 16.3 08/23/2019   HCT 48.1 08/23/2019   MCV 95 08/23/2019   PLT 236 08/23/2019   Attestation Statements:   Reviewed by clinician on day of visit: allergies, medications, problem list, medical history, surgical history, family history, social history, and previous encounter notes.  I,  Insurance claims handler, CMA, am acting as Energy manager for [employer], DO.  I have reviewed the above documentation for accuracy and completeness, and I agree with the above. Helane Rima, DO

## 2019-10-03 ENCOUNTER — Telehealth: Payer: Self-pay

## 2019-10-03 NOTE — Telephone Encounter (Signed)
We have attempted to call the patient two times to schedule sleep study.  Patient has been unavailable at the phone numbers we have on file and has not returned our calls. If patient calls back we will schedule them for their sleep study.  

## 2019-10-23 DIAGNOSIS — E559 Vitamin D deficiency, unspecified: Secondary | ICD-10-CM | POA: Insufficient documentation

## 2019-10-23 DIAGNOSIS — R632 Polyphagia: Secondary | ICD-10-CM | POA: Insufficient documentation

## 2019-10-23 DIAGNOSIS — F258 Other schizoaffective disorders: Secondary | ICD-10-CM | POA: Insufficient documentation

## 2019-10-24 ENCOUNTER — Ambulatory Visit (INDEPENDENT_AMBULATORY_CARE_PROVIDER_SITE_OTHER): Payer: 59 | Admitting: Family Medicine

## 2019-12-05 DIAGNOSIS — F331 Major depressive disorder, recurrent, moderate: Secondary | ICD-10-CM | POA: Diagnosis not present

## 2019-12-12 DIAGNOSIS — F331 Major depressive disorder, recurrent, moderate: Secondary | ICD-10-CM | POA: Diagnosis not present

## 2019-12-25 ENCOUNTER — Other Ambulatory Visit (INDEPENDENT_AMBULATORY_CARE_PROVIDER_SITE_OTHER): Payer: Self-pay | Admitting: Family Medicine

## 2019-12-25 DIAGNOSIS — E8881 Metabolic syndrome: Secondary | ICD-10-CM

## 2019-12-25 DIAGNOSIS — I10 Essential (primary) hypertension: Secondary | ICD-10-CM | POA: Diagnosis not present

## 2019-12-25 DIAGNOSIS — R079 Chest pain, unspecified: Secondary | ICD-10-CM | POA: Diagnosis not present

## 2019-12-25 DIAGNOSIS — Z885 Allergy status to narcotic agent status: Secondary | ICD-10-CM | POA: Diagnosis not present

## 2019-12-25 DIAGNOSIS — Z87891 Personal history of nicotine dependence: Secondary | ICD-10-CM | POA: Diagnosis not present

## 2019-12-25 DIAGNOSIS — R0789 Other chest pain: Secondary | ICD-10-CM | POA: Diagnosis not present

## 2019-12-25 DIAGNOSIS — R0602 Shortness of breath: Secondary | ICD-10-CM | POA: Diagnosis not present

## 2019-12-26 DIAGNOSIS — F331 Major depressive disorder, recurrent, moderate: Secondary | ICD-10-CM | POA: Diagnosis not present

## 2020-01-02 DIAGNOSIS — F331 Major depressive disorder, recurrent, moderate: Secondary | ICD-10-CM | POA: Diagnosis not present

## 2020-01-09 DIAGNOSIS — F331 Major depressive disorder, recurrent, moderate: Secondary | ICD-10-CM | POA: Diagnosis not present

## 2020-01-16 DIAGNOSIS — F331 Major depressive disorder, recurrent, moderate: Secondary | ICD-10-CM | POA: Diagnosis not present

## 2020-01-23 DIAGNOSIS — F331 Major depressive disorder, recurrent, moderate: Secondary | ICD-10-CM | POA: Diagnosis not present

## 2020-01-26 ENCOUNTER — Inpatient Hospital Stay: Payer: 59 | Admitting: Osteopathic Medicine

## 2020-02-01 ENCOUNTER — Other Ambulatory Visit: Payer: Self-pay

## 2020-02-01 ENCOUNTER — Encounter: Payer: Self-pay | Admitting: Osteopathic Medicine

## 2020-02-01 ENCOUNTER — Ambulatory Visit (INDEPENDENT_AMBULATORY_CARE_PROVIDER_SITE_OTHER): Payer: 59 | Admitting: Osteopathic Medicine

## 2020-02-01 VITALS — BP 123/82 | HR 84 | Wt 336.0 lb

## 2020-02-01 DIAGNOSIS — F410 Panic disorder [episodic paroxysmal anxiety] without agoraphobia: Secondary | ICD-10-CM | POA: Diagnosis not present

## 2020-02-01 DIAGNOSIS — R030 Elevated blood-pressure reading, without diagnosis of hypertension: Secondary | ICD-10-CM | POA: Diagnosis not present

## 2020-02-01 DIAGNOSIS — F411 Generalized anxiety disorder: Secondary | ICD-10-CM | POA: Diagnosis not present

## 2020-02-01 DIAGNOSIS — F331 Major depressive disorder, recurrent, moderate: Secondary | ICD-10-CM | POA: Diagnosis not present

## 2020-02-01 DIAGNOSIS — F258 Other schizoaffective disorders: Secondary | ICD-10-CM | POA: Diagnosis not present

## 2020-02-01 MED ORDER — ARIPIPRAZOLE 10 MG PO TABS: 10.0000 mg | ORAL_TABLET | Freq: Every day | ORAL | 3 refills | Status: DC

## 2020-02-01 NOTE — Progress Notes (Signed)
.   Nicholas Ortega is a 39 y.o. male who presents to  [facility] at [facility]  today, 02/01/20, seeking care for the following:  . Concern for BP - ER visit 12/25/19 for chest pain records reviewed, BP last filed VS was 142/93, today in office 123/82, no further CP episodes. ACS r/o serial tropes, D-dimer WNL, advised f/u PCP.  Marland Kitchen Needs refill on Abilify - mental health doing really well on this, psych hadn't made changes inawhile, he'd like to continue current regiment without having to go to specialist.      ASSESSMENT & PLAN with other pertinent findings:  The primary encounter diagnosis was Borderline blood pressure. A diagnosis of Other schizoaffective disorders (HCC) was also pertinent to this visit.   1. Other schizoaffective disorders (HCC) Refilled Rx Plan follow w/ psychiatry prn  I'm ok to manage alprazolam - pt to call pharmacy or [place] when due for refills, he uses this Rx sparingly  2. Borderline blood pressure Monitor at work few times per week for the next couple weeks Discussed BP goals  Working on weight loss and doing well!   No results found for this or any previous visit (from the past 24 hour(s)).     There are no Patient Instructions on file for this visit.  No orders of the defined types were placed in this encounter.   Meds ordered this encounter  Medications  . ARIPiprazole (ABILIFY) 10 MG tablet    Sig: Take 1 tablet (10 mg total) by mouth daily.    Dispense:  90 tablet    Refill:  3       Follow-up instructions: Return for ANNUAL CHECK-UP AND LABS AROUND 07/2020 - SEE [place] SOONER IF NEEDED .                                         BP 123/82 (BP Location: Right Arm, Patient Position: Sitting)   Pulse 84   Wt (!) 336 lb (152.4 kg)   SpO2 98%   BMI 44.33 kg/m   Current Meds  Medication Sig  . ALPRAZolam (XANAX) 0.5 MG tablet Take 1 tablet (0.5 mg total) by mouth  3 (three) times daily as needed for anxiety.  . ARIPiprazole (ABILIFY) 10 MG tablet Take 1 tablet (10 mg total) by mouth daily.  . phentermine 15 MG capsule Take 1 capsule (15 mg total) by mouth every morning.  . Vitamin D, Ergocalciferol, (DRISDOL) 1.25 MG (50000 UNIT) CAPS capsule Take 1 capsule (50,000 Units total) by mouth every 7 (seven) days.  . [DISCONTINUED] ARIPiprazole (ABILIFY) 10 MG tablet Take 1 tablet (10 mg total) by mouth daily.  . [DISCONTINUED] traZODone (DESYREL) 50 MG tablet Take 1 tablet (50 mg total) by mouth at bedtime.    No results found for this or any previous visit (from the past 72 hour(s)).  No results found.     All questions at time of visit were answered - patient instructed to contact office with any additional concerns or updates.  ER/RTC precautions were reviewed with the patient as applicable.   Please note: voice recognition software was used to produce this document, and typos may escape review. Please contact Dr. Lyn Hollingshead for any needed clarifications.

## 2020-02-06 DIAGNOSIS — F331 Major depressive disorder, recurrent, moderate: Secondary | ICD-10-CM | POA: Diagnosis not present

## 2020-02-13 DIAGNOSIS — F331 Major depressive disorder, recurrent, moderate: Secondary | ICD-10-CM | POA: Diagnosis not present

## 2020-02-17 MED ORDER — ALPRAZOLAM 0.5 MG PO TABS: 0.5000 mg | ORAL_TABLET | Freq: Three times a day (TID) | ORAL | 2 refills | Status: DC | PRN

## 2020-02-17 NOTE — Addendum Note (Signed)
Addended by: Deirdre Pippins on: 02/17/2020 02:56 PM   Modules accepted: Orders

## 2020-02-20 DIAGNOSIS — F331 Major depressive disorder, recurrent, moderate: Secondary | ICD-10-CM | POA: Diagnosis not present

## 2020-02-27 DIAGNOSIS — F331 Major depressive disorder, recurrent, moderate: Secondary | ICD-10-CM | POA: Diagnosis not present

## 2020-03-05 DIAGNOSIS — F331 Major depressive disorder, recurrent, moderate: Secondary | ICD-10-CM | POA: Diagnosis not present

## 2020-03-19 DIAGNOSIS — F331 Major depressive disorder, recurrent, moderate: Secondary | ICD-10-CM | POA: Diagnosis not present

## 2020-03-26 DIAGNOSIS — F331 Major depressive disorder, recurrent, moderate: Secondary | ICD-10-CM | POA: Diagnosis not present

## 2020-04-02 ENCOUNTER — Emergency Department (INDEPENDENT_AMBULATORY_CARE_PROVIDER_SITE_OTHER): Payer: 59

## 2020-04-02 ENCOUNTER — Other Ambulatory Visit: Payer: Self-pay

## 2020-04-02 ENCOUNTER — Emergency Department (INDEPENDENT_AMBULATORY_CARE_PROVIDER_SITE_OTHER)
Admission: EM | Admit: 2020-04-02 | Discharge: 2020-04-02 | Disposition: A | Discharge status: Home / Self Care | Payer: 59 | Source: Home / Self Care

## 2020-04-02 ENCOUNTER — Encounter: Payer: Self-pay | Admitting: Osteopathic Medicine

## 2020-04-02 DIAGNOSIS — R1031 Right lower quadrant pain: Secondary | ICD-10-CM

## 2020-04-02 DIAGNOSIS — Z87891 Personal history of nicotine dependence: Secondary | ICD-10-CM | POA: Diagnosis not present

## 2020-04-02 DIAGNOSIS — R109 Unspecified abdominal pain: Secondary | ICD-10-CM

## 2020-04-02 DIAGNOSIS — I10 Essential (primary) hypertension: Secondary | ICD-10-CM | POA: Diagnosis not present

## 2020-04-02 DIAGNOSIS — D72829 Elevated white blood cell count, unspecified: Secondary | ICD-10-CM

## 2020-04-02 DIAGNOSIS — R590 Localized enlarged lymph nodes: Secondary | ICD-10-CM | POA: Diagnosis not present

## 2020-04-02 DIAGNOSIS — F331 Major depressive disorder, recurrent, moderate: Secondary | ICD-10-CM | POA: Diagnosis not present

## 2020-04-02 DIAGNOSIS — Z79899 Other long term (current) drug therapy: Secondary | ICD-10-CM | POA: Diagnosis not present

## 2020-04-02 DIAGNOSIS — R350 Frequency of micturition: Secondary | ICD-10-CM | POA: Diagnosis not present

## 2020-04-02 DIAGNOSIS — N200 Calculus of kidney: Secondary | ICD-10-CM | POA: Diagnosis not present

## 2020-04-02 DIAGNOSIS — R319 Hematuria, unspecified: Secondary | ICD-10-CM

## 2020-04-02 DIAGNOSIS — N132 Hydronephrosis with renal and ureteral calculous obstruction: Secondary | ICD-10-CM | POA: Diagnosis not present

## 2020-04-02 DIAGNOSIS — Z7951 Long term (current) use of inhaled steroids: Secondary | ICD-10-CM | POA: Diagnosis not present

## 2020-04-02 DIAGNOSIS — R110 Nausea: Secondary | ICD-10-CM | POA: Diagnosis not present

## 2020-04-02 DIAGNOSIS — I878 Other specified disorders of veins: Secondary | ICD-10-CM | POA: Diagnosis not present

## 2020-04-02 LAB — POCT CBC W AUTO DIFF (K'VILLE URGENT CARE)

## 2020-04-02 LAB — POCT URINALYSIS DIP (MANUAL ENTRY)
Glucose, UA: NEGATIVE mg/dL
Leukocytes, UA: NEGATIVE
Nitrite, UA: NEGATIVE
Protein Ur, POC: 30 mg/dL — AB
Spec Grav, UA: >=1.03 — AB (ref 1.010–1.025)
Urobilinogen, UA: 0.2 E.U./dL
pH, UA: 6 (ref 5.0–8.0)

## 2020-04-02 NOTE — ED Notes (Signed)
Patient is being discharged from the Urgent Care and sent to the Emergency Department via POV. Per Waylan Rocher, PAC, patient is in need of higher level of care due to elevated white count and severe RT flank pain and need for CT to r/o possible appendicitis. Patient is aware and verbalizes understanding of plan of care.  Vitals:   04/02/20 1723  BP: (!) 159/96  Pulse: 64  Resp: 17  Temp: 98.3 F (36.8 C)  SpO2: 98%

## 2020-04-02 NOTE — ED Provider Notes (Signed)
[facility]    CSN: [id number] Arrival date & time: 04/02/20  1713      History   Chief Complaint Chief Complaint  Patient presents with  . Urinary Frequency  . Back Pain    Lower  . Flank Pain    RT    HPI Adonys Wildes is a 39 y.o. male.   HPI Ashir Kunz is a 39 y.o. male presenting to UC with c/o 3 episodes of Right flank pain that started this past week. Pain lasts a few hours at a time, sharp in nature. Current episode started a few hours PTA and has been a constant ache.  He has taken ibuprofen 800mg  at 1PM without relief. Pain is 6/10.  Associated urinary frequency and nausea but no vomiting or diarrhea. No personal or family hx of kidney stones.    Past Medical History:  Diagnosis Date  . ADD (attention deficit disorder)   . Alcohol abuse   . Alcohol addiction (HCC)   . Alcohol use   . Anxiety   . Bipolar disorder (manic depression) (HCC)   . Depression   . Drug addiction in remission (HCC)   . Family history of depression   . History of drug use   . HTN (hypertension)   . HTN (hypertension)   . Obesity     Patient Active Problem List   Diagnosis Date Noted  . Other schizoaffective disorders (HCC) 10/23/2019  . Vitamin D deficiency 10/23/2019  . Polyphagia 10/23/2019  . Class 3 severe obesity due to excess calories without serious comorbidity with body mass index (BMI) of 45.0 to 49.9 in adult (HCC) 09/05/2019  . Shifting sleep-work schedule, affecting sleep 09/05/2019  . OSA (obstructive sleep apnea) 09/05/2019  . Sleep related headaches 09/05/2019  . GERD with apnea 09/05/2019  . Choking 09/05/2019    History reviewed. No pertinent surgical history.     Home Medications    Prior to Admission medications   Medication Sig Start Date End Date Taking? Authorizing Provider  ALPRAZolam 11/05/2019) 0.5 MG tablet Take 1 tablet (0.5 mg total) by mouth 3 (three) times daily as needed for anxiety. 02/17/20   02/19/20, DO    ARIPiprazole (ABILIFY) 10 MG tablet Take 1 tablet (10 mg total) by mouth daily. 02/01/20   04/02/20, DO  phentermine 15 MG capsule Take 1 capsule (15 mg total) by mouth every morning. 09/21/19   09/23/19, DO  Vitamin D, Ergocalciferol, (DRISDOL) 1.25 MG (50000 UNIT) CAPS capsule Take 1 capsule (50,000 Units total) by mouth every 7 (seven) days. 09/06/19   11/06/19, DO    Family History Family History  Problem Relation Age of Onset  . Transient ischemic attack Father   . Depression Father   . High blood pressure Father   . Stroke Father   . Anxiety disorder Father   . Alcoholism Father   . Depression Mother   . Anxiety disorder Mother   . Alcoholism Mother   . Depression Brother   . Diabetes Brother   . Breast cancer Paternal Grandmother     Social History Social History   Tobacco Use  . Smoking status: Former Smoker    Packs/day: 0.50    Years: 20.00    Pack years: 10.00    Types: Cigars    Quit date: 06/30/2018    Years since quitting: 1.7  . Smokeless tobacco: Never Used  Vaping Use  . Vaping Use: Never used  Substance Use Topics  .  Alcohol use: Yes    Alcohol/week: 8.0 - 10.0 standard drinks    Types: 8 - 10 Standard drinks or equivalent per week    Comment: occ  . Drug use: Not Currently     Allergies   Hydrocodone   Review of Systems Review of Systems  Constitutional: Negative for chills and fever.  Gastrointestinal: Positive for abdominal pain (Right side) and nausea. Negative for diarrhea and vomiting.  Genitourinary: Positive for flank pain (Right), frequency and urgency. Negative for decreased urine volume, dysuria and hematuria.  Musculoskeletal: Positive for back pain.  Neurological: Negative for dizziness and headaches.     Physical Exam Triage Vital Signs ED Triage Vitals  Enc Vitals Group     BP 04/02/20 1723 (!) 159/96     Pulse Rate 04/02/20 1723 64     Resp 04/02/20 1723 17     Temp 04/02/20 1723 98.3 F (36.8 C)      Temp Source 04/02/20 1723 Oral     SpO2 04/02/20 1723 98 %     Weight --      Height --      Head Circumference --      Peak Flow --      Pain Score 04/02/20 1724 6     Pain Loc --      Pain Edu? --      Excl. in GC? --    No data found.  Updated Vital Signs BP (!) 159/96 (BP Location: Left Arm)   Pulse 64   Temp 98.3 F (36.8 C) (Oral)   Resp 17   SpO2 98%   Visual Acuity Right Eye Distance:   Left Eye Distance:   Bilateral Distance:    Right Eye Near:   Left Eye Near:    Bilateral Near:     Physical Exam Vitals and nursing note reviewed.  Constitutional:      General: He is not in acute distress.    Appearance: Normal appearance. He is well-developed. He is obese. He is not ill-appearing, toxic-appearing or diaphoretic.  HENT:     Head: Normocephalic and atraumatic.  Cardiovascular:     Rate and Rhythm: Normal rate and regular rhythm.  Pulmonary:     Effort: Pulmonary effort is normal. No respiratory distress.     Breath sounds: Normal breath sounds.  Abdominal:     General: There is no distension.     Palpations: Abdomen is soft. There is no mass.     Tenderness: There is abdominal tenderness (Right side). There is right CVA tenderness. There is no left CVA tenderness, guarding or rebound.     Hernia: No hernia is present.    Musculoskeletal:        General: Normal range of motion.     Cervical back: Normal range of motion.  Skin:    General: Skin is warm and dry.  Neurological:     Mental Status: He is alert and oriented to person, place, and time.  Psychiatric:        Behavior: Behavior normal.      UC Treatments / Results  Labs (all labs ordered are listed, but only abnormal results are displayed) Labs Reviewed  POCT URINALYSIS DIP (MANUAL ENTRY) - Abnormal; Notable for the following components:      Result Value   Bilirubin, UA small (*)    Ketones, POC UA >= (160) (*)    Spec Grav, UA >=1.030 (*)    Blood, UA large (*)  Protein Ur,  POC =30 (*)    All other components within normal limits  COMPLETE METABOLIC PANEL WITH GFR  POCT CBC W AUTO DIFF ([facility])    EKG   Radiology DG Abdomen 1 View  Result Date: 04/02/2020 CLINICAL DATA:  Right flank pain and hematuria. EXAM: ABDOMEN - 1 VIEW COMPARISON:  None. FINDINGS: There is an ovoid 7 mm calcification in the right pelvis, as well as the 3 mm calcification in the left pelvis. There is are typical of phleboliths, but nonspecific. No other soft tissue calcifications or evidence of radiopaque calculi over the renal beds or ureters. Normal bowel gas pattern without obstruction. No concerning intraabdominal mass effect. No acute osseous abnormalities are seen. IMPRESSION: Bilateral pelvic calcifications, 7 mm on the right and 3 mm on the left are typical of phleboliths, but nonspecific. No other soft tissue calcifications or evidence of radiopaque calculi. Electronically Signed   By: Narda Rutherford M.D.   On: 04/02/2020 18:32    Procedures Procedures (including critical care time)  Medications Ordered in UC Medications - No data to display  Initial Impression / Assessment and Plan / UC Course  I have reviewed the triage vital signs and the nursing notes.  Pertinent labs & imaging results that were available during my care of the patient were reviewed by me and considered in my medical decision making (see chart for details).    CBC: WBC 11.9 CMP pending  KUB: no large ureteral stone(s) to account for pt's worsening Right flank pain Recommend further evaluation this evening in emergency department Pt understanding and agreeable with tx plan. DDx: cholecystitis, appendicitis, diverticulitis, recently passed ureteral stone.  Pt declined EMS transport, plans to drive himself to [facility]   Final Clinical Impressions(s) / UC Diagnoses   Final diagnoses:  Right flank pain  Hematuria, unspecified type  RLQ abdominal pain  Leukocytosis,  unspecified type     Discharge Instructions      You have declined EMS transport but it is recommended you drive yourself directly to the emergency department for further evaluation and treatment of your pain.  The hospital will have additional lab work and imaging they can use the help determine the cause of your symptoms including checking for appendicitis, which is considered a medical emergency.  Do not eat or drink anything along the way.     ED Prescriptions    None     PDMP not reviewed this encounter.   Lurene Shadow, [place] 04/02/20 1903

## 2020-04-02 NOTE — Discharge Instructions (Signed)
°  You have declined EMS transport but it is recommended you drive yourself directly to the emergency department for further evaluation and treatment of your pain.  The hospital will have additional lab work and imaging they can use the help determine the cause of your symptoms including checking for appendicitis, which is considered a medical emergency.  Do not eat or drink anything along the way.

## 2020-04-02 NOTE — ED Triage Notes (Signed)
Pt c/o RT side flank pain x 3 times in the last week. Last about 3 hours at a time. Also urinary frequency whenever the pain occurs. 800mg  ibuprofen at 1pm. Pain 6/10  No hx of kidney stones.

## 2020-04-03 DIAGNOSIS — Z87891 Personal history of nicotine dependence: Secondary | ICD-10-CM | POA: Diagnosis not present

## 2020-04-03 DIAGNOSIS — I10 Essential (primary) hypertension: Secondary | ICD-10-CM | POA: Diagnosis not present

## 2020-04-03 DIAGNOSIS — N132 Hydronephrosis with renal and ureteral calculous obstruction: Secondary | ICD-10-CM | POA: Diagnosis not present

## 2020-04-03 DIAGNOSIS — R1031 Right lower quadrant pain: Secondary | ICD-10-CM | POA: Diagnosis not present

## 2020-04-03 DIAGNOSIS — R590 Localized enlarged lymph nodes: Secondary | ICD-10-CM | POA: Diagnosis not present

## 2020-04-03 DIAGNOSIS — R350 Frequency of micturition: Secondary | ICD-10-CM | POA: Diagnosis not present

## 2020-04-03 DIAGNOSIS — N200 Calculus of kidney: Secondary | ICD-10-CM | POA: Diagnosis not present

## 2020-04-03 DIAGNOSIS — Z7951 Long term (current) use of inhaled steroids: Secondary | ICD-10-CM | POA: Diagnosis not present

## 2020-04-03 DIAGNOSIS — Z79899 Other long term (current) drug therapy: Secondary | ICD-10-CM | POA: Diagnosis not present

## 2020-04-03 DIAGNOSIS — R110 Nausea: Secondary | ICD-10-CM | POA: Diagnosis not present

## 2020-04-03 LAB — COMPLETE METABOLIC PANEL WITH GFR
AG Ratio: 1.9 (calc) (ref 1.0–2.5)
ALT: 14 U/L (ref 9–46)
AST: 20 U/L (ref 10–40)
Albumin: 4.4 g/dL (ref 3.6–5.1)
Alkaline phosphatase (APISO): 58 U/L (ref 36–130)
BUN: 22 mg/dL (ref 7–25)
CO2: 23 mmol/L (ref 20–32)
Calcium: 9.5 mg/dL (ref 8.6–10.3)
Chloride: 108 mmol/L (ref 98–110)
Creat: 1.08 mg/dL (ref 0.60–1.35)
GFR, Est African American: 100 mL/min/{1.73_m2} (ref >=60)
GFR, Est Non African American: 87 mL/min/{1.73_m2} (ref >=60)
Globulin: 2.3 g/dL (calc) (ref 1.9–3.7)
Glucose, Bld: 85 mg/dL (ref 65–99)
Potassium: 4 mmol/L (ref 3.5–5.3)
Sodium: 141 mmol/L (ref 135–146)
Total Bilirubin: 0.9 mg/dL (ref 0.2–1.2)
Total Protein: 6.7 g/dL (ref 6.1–8.1)

## 2020-04-09 DIAGNOSIS — F331 Major depressive disorder, recurrent, moderate: Secondary | ICD-10-CM | POA: Diagnosis not present

## 2020-04-16 DIAGNOSIS — F331 Major depressive disorder, recurrent, moderate: Secondary | ICD-10-CM | POA: Diagnosis not present

## 2020-04-30 DIAGNOSIS — F331 Major depressive disorder, recurrent, moderate: Secondary | ICD-10-CM | POA: Diagnosis not present

## 2020-05-07 DIAGNOSIS — F331 Major depressive disorder, recurrent, moderate: Secondary | ICD-10-CM | POA: Diagnosis not present

## 2020-05-14 DIAGNOSIS — F331 Major depressive disorder, recurrent, moderate: Secondary | ICD-10-CM | POA: Diagnosis not present

## 2020-05-21 DIAGNOSIS — F331 Major depressive disorder, recurrent, moderate: Secondary | ICD-10-CM | POA: Diagnosis not present

## 2020-06-11 DIAGNOSIS — F331 Major depressive disorder, recurrent, moderate: Secondary | ICD-10-CM | POA: Diagnosis not present

## 2020-06-18 DIAGNOSIS — F331 Major depressive disorder, recurrent, moderate: Secondary | ICD-10-CM | POA: Diagnosis not present

## 2020-06-25 DIAGNOSIS — F331 Major depressive disorder, recurrent, moderate: Secondary | ICD-10-CM | POA: Diagnosis not present

## 2020-07-05 ENCOUNTER — Encounter: Payer: Self-pay | Admitting: Osteopathic Medicine

## 2020-07-05 DIAGNOSIS — F410 Panic disorder [episodic paroxysmal anxiety] without agoraphobia: Secondary | ICD-10-CM

## 2020-07-05 DIAGNOSIS — F411 Generalized anxiety disorder: Secondary | ICD-10-CM

## 2020-07-05 NOTE — Telephone Encounter (Signed)
Last written 02/17/2020 #90 with 2 refills Last appt 02/01/2020 Lyn Hollingshead pt Pended with note that patient needs appt for future refills

## 2020-07-06 MED ORDER — ALPRAZOLAM 0.5 MG PO TABS: 0.5000 mg | ORAL_TABLET | Freq: Three times a day (TID) | ORAL | 0 refills | Status: DC | PRN

## 2020-07-20 ENCOUNTER — Telehealth (INDEPENDENT_AMBULATORY_CARE_PROVIDER_SITE_OTHER): Payer: 59 | Admitting: Osteopathic Medicine

## 2020-07-20 ENCOUNTER — Encounter: Payer: Self-pay | Admitting: Osteopathic Medicine

## 2020-07-20 DIAGNOSIS — F411 Generalized anxiety disorder: Secondary | ICD-10-CM | POA: Diagnosis not present

## 2020-07-20 DIAGNOSIS — F410 Panic disorder [episodic paroxysmal anxiety] without agoraphobia: Secondary | ICD-10-CM

## 2020-07-20 DIAGNOSIS — E559 Vitamin D deficiency, unspecified: Secondary | ICD-10-CM | POA: Diagnosis not present

## 2020-07-20 DIAGNOSIS — G4700 Insomnia, unspecified: Secondary | ICD-10-CM | POA: Diagnosis not present

## 2020-07-20 DIAGNOSIS — R030 Elevated blood-pressure reading, without diagnosis of hypertension: Secondary | ICD-10-CM | POA: Diagnosis not present

## 2020-07-20 DIAGNOSIS — Z0000 Encounter for general adult medical examination without abnormal findings: Secondary | ICD-10-CM

## 2020-07-20 DIAGNOSIS — Z113 Encounter for screening for infections with a predominantly sexual mode of transmission: Secondary | ICD-10-CM

## 2020-07-20 MED ORDER — ALPRAZOLAM 0.5 MG PO TABS: 0.5000 mg | ORAL_TABLET | Freq: Three times a day (TID) | ORAL | 0 refills | Status: DC | PRN

## 2020-07-20 NOTE — Progress Notes (Signed)
Telemedicine Visit via  Video & Audio (App used: MyChart)  I connected with Nicholas Ortega on 07/20/20 at 8:32 AM  by phone or  telemedicine application as noted above  I verified that I am speaking with or regarding  the correct patient using two identifiers.  Participants: Myself, Dr Sunnie Nielsen DO Patient: Nicholas Ortega Patient proxy if applicable: none Other, if applicable: none  Patient is at home I am in office at [facility]    I discussed the limitations of evaluation and management  by telemedicine and the availability of in person appointments.  The participant(s) above expressed understanding and  agreed to proceed with this appointment via telemedicine.       History of Present Illness: Nicholas Ortega is a 39 y.o. male who would like to discuss STI screening, routine. Also not due for xanax refill but wanted to know what to do when he is     No known STI exposure.  No history of blisters to suggest HSV.  Just wants routine screening.  Also about due for routine annual labs.    Otherwise, patient reports doing well.  Currently stable on medications and engaging in therapy.   Observations/Objective: There were no vitals taken for this visit. BP Readings from Last 3 Encounters:  04/02/20 (!) 159/96  02/01/20 123/82  09/28/19 117/75   Exam: Normal Speech.  NAD  Lab and Radiology Results No results found for this or any previous visit (from the past 72 hour(s)). No results found.     Assessment and Plan: 40 y.o. male with The primary encounter diagnosis was Routine screening for STI (sexually transmitted infection). Diagnoses of Panic attacks, Generalized anxiety disorder, Annual physical exam, Borderline blood pressure, Vitamin D deficiency, and Insomnia, unspecified type were also pertinent to this visit.  Labs ordered for future visit. Annual physical / preventive care was NOT performed or billed today.   PDMP not reviewed this  encounter. Orders Placed This Encounter  Procedures   Chlamydia/Gonococcus/Trichomonas, NAA   Hepatitis B core antibody, total   Hepatitis B surface antigen   Hepatitis C antibody, reflex   HIV Antibody (routine testing w rflx)   RPR   CBC   COMPLETE METABOLIC PANEL WITH GFR   Lipid panel   VITAMIN D 25 Hydroxy (Vit-D Deficiency, Fractures)   Meds ordered this encounter  Medications   ALPRAZolam (XANAX) 0.5 MG tablet    Sig: Take 1 tablet (0.5 mg total) by mouth 3 (three) times daily as needed for anxiety.    Dispense:  90 tablet    Refill:  0   There are no Patient Instructions on file for this visit.  Instructions sent via MyChart.   Follow Up Instructions: Return in about 6 months (around 01/17/2021) for ANNUAL CHECK-UP - SEE [place] SOONER IF NEEDED.    I discussed the assessment and treatment plan with the patient. The patient was provided an opportunity to ask questions and all were answered. The patient agreed with the plan and demonstrated an understanding of the instructions.   The patient was advised to call back or seek an in-person evaluation if any new concerns, if symptoms worsen or if the condition fails to improve as anticipated.  30 minutes of non-face-to-face time was provided during this encounter.      . . . . . . . . . . . . . Marland Kitchen                   Historical  information moved to improve visibility of documentation.  Past Medical History:  Diagnosis Date   ADD (attention deficit disorder)    Alcohol abuse    Alcohol addiction (HCC)    Alcohol use    Anxiety    Bipolar disorder (manic depression) (HCC)    Depression    Drug addiction in remission [facility])    Family history of depression    History of drug use    HTN (hypertension)    HTN (hypertension)    Obesity    No past surgical history on file. Social History   Tobacco Use   Smoking status: Former Smoker    Packs/day: 0.50    Years:  20.00    Pack years: 10.00    Types: Cigars    Quit date: 06/30/2018    Years since quitting: 2.0   Smokeless tobacco: Never Used  Substance Use Topics   Alcohol use: Yes    Alcohol/week: 8.0 - 10.0 standard drinks    Types: 8 - 10 Standard drinks or equivalent per week    Comment: occ   family history includes Alcoholism in his father and mother; Anxiety disorder in his father and mother; Breast cancer in his paternal grandmother; Depression in his brother, father, and mother; Diabetes in his brother; High blood pressure in his father; Stroke in his father; Transient ischemic attack in his father.  Medications: Current Outpatient Medications  Medication Sig Dispense Refill   ARIPiprazole (ABILIFY) 10 MG tablet Take 1 tablet (10 mg total) by mouth daily. 90 tablet 3   Vitamin D, Ergocalciferol, (DRISDOL) 1.25 MG (50000 UNIT) CAPS capsule Take 1 capsule (50,000 Units total) by mouth every 7 (seven) days. 4 capsule 0   ALPRAZolam (XANAX) 0.5 MG tablet Take 1 tablet (0.5 mg total) by mouth 3 (three) times daily as needed for anxiety. 90 tablet 0   No current facility-administered medications for this visit.   Allergies  Allergen Reactions   Hydrocodone Palpitations     If phone visit, billing and coding can please add appropriate modifier if needed

## 2020-07-20 NOTE — Progress Notes (Signed)
Attempted to contact the patient at 822 am, no answer. Left a detailed vm msg.

## 2021-04-11 ENCOUNTER — Emergency Department (INDEPENDENT_AMBULATORY_CARE_PROVIDER_SITE_OTHER): Payer: 59

## 2021-04-11 ENCOUNTER — Emergency Department (INDEPENDENT_AMBULATORY_CARE_PROVIDER_SITE_OTHER)
Admission: EM | Admit: 2021-04-11 | Discharge: 2021-04-11 | Disposition: A | Discharge status: Home / Self Care | Payer: 59 | Source: Home / Self Care

## 2021-04-11 ENCOUNTER — Encounter: Payer: Self-pay | Admitting: Emergency Medicine

## 2021-04-11 DIAGNOSIS — J441 Chronic obstructive pulmonary disease with (acute) exacerbation: Secondary | ICD-10-CM | POA: Diagnosis not present

## 2021-04-11 DIAGNOSIS — R059 Cough, unspecified: Secondary | ICD-10-CM

## 2021-04-11 DIAGNOSIS — R0602 Shortness of breath: Secondary | ICD-10-CM | POA: Diagnosis not present

## 2021-04-11 DIAGNOSIS — J309 Allergic rhinitis, unspecified: Secondary | ICD-10-CM

## 2021-04-11 MED ORDER — DOXYCYCLINE HYCLATE 100 MG PO CAPS: 100.0000 mg | ORAL_CAPSULE | Freq: Two times a day (BID) | ORAL | 0 refills | Status: DC

## 2021-04-11 MED ORDER — FEXOFENADINE HCL 180 MG PO TABS: 180.0000 mg | ORAL_TABLET | Freq: Every day | ORAL | 0 refills | Status: DC

## 2021-04-11 MED ORDER — PREDNISONE 20 MG PO TABS: ORAL_TABLET | ORAL | 0 refills | Status: DC

## 2021-04-11 NOTE — Discharge Instructions (Addendum)
Advised/instructed patient to take medication as directed with food to completion.  Encouraged patient increase daily water intake while taking these medications.  Advised patient to take prednisone and Allegra with first dose of antibiotic for the next 9-10 days.
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
## 2021-12-11 IMAGING — DX DG SHOULDER 2+V*R*
3 series · 3 of 3 positions shown · non-contrast
Comparison: None.

CLINICAL DATA: Pain.  Injury.

EXAM:
RIGHT SHOULDER - 2+ VIEW

[shoulder grashey]
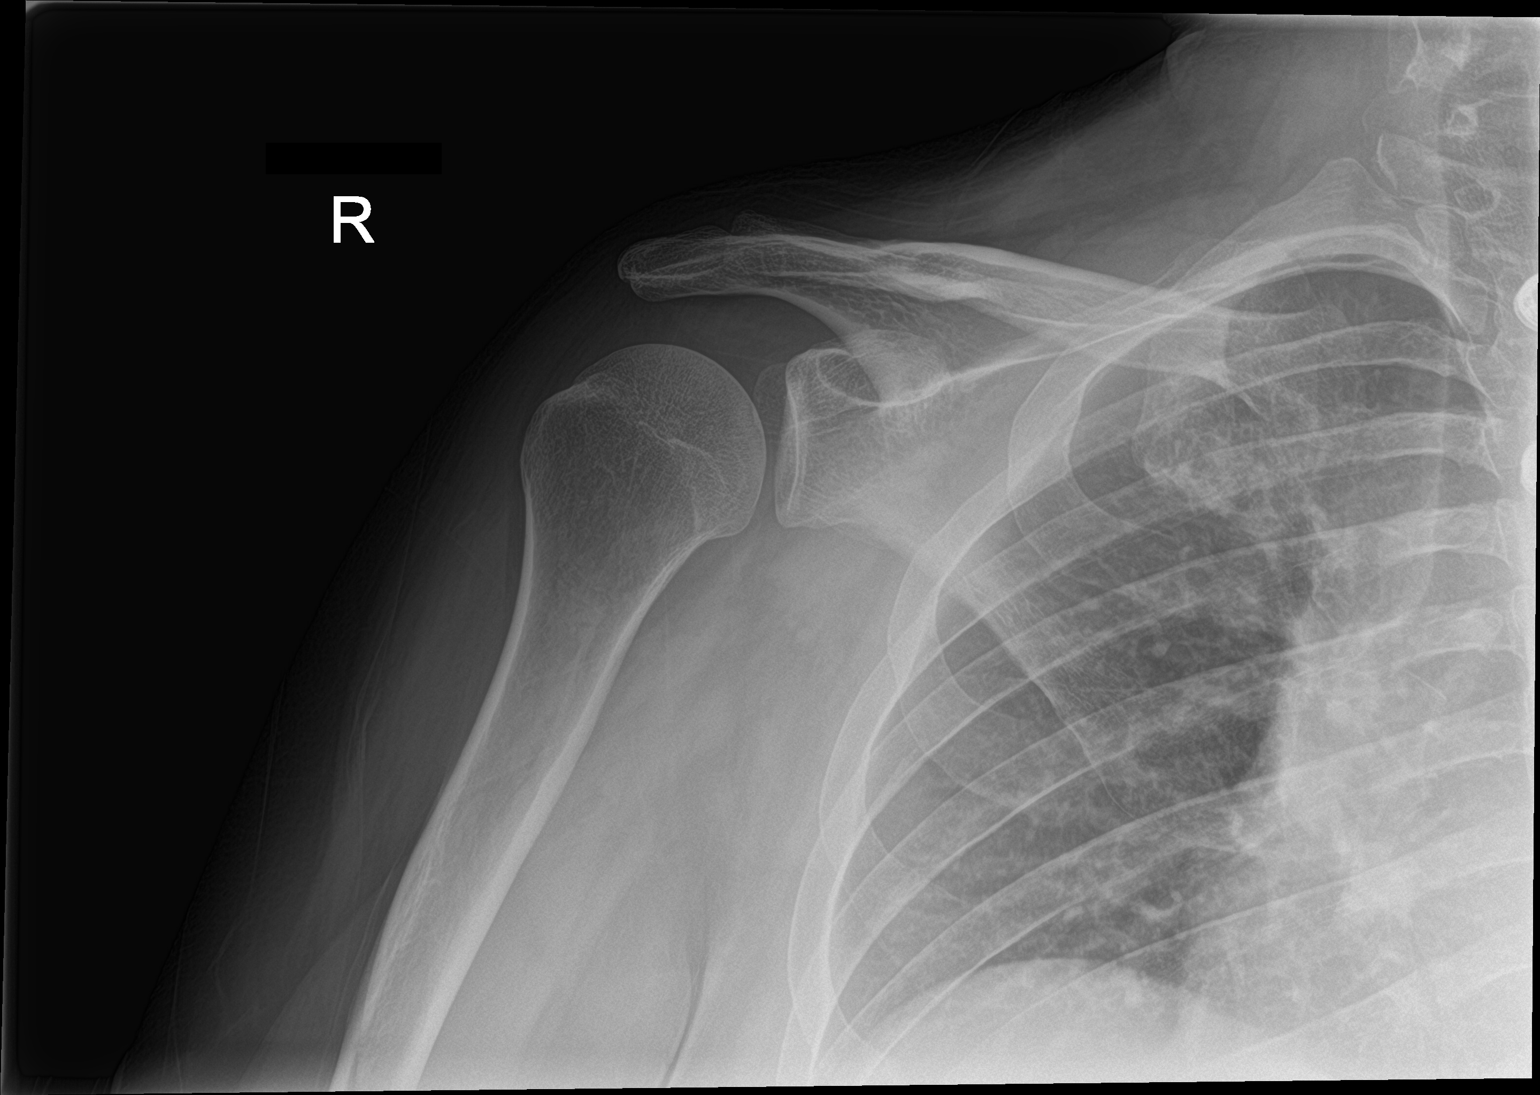

[shoulder y view]
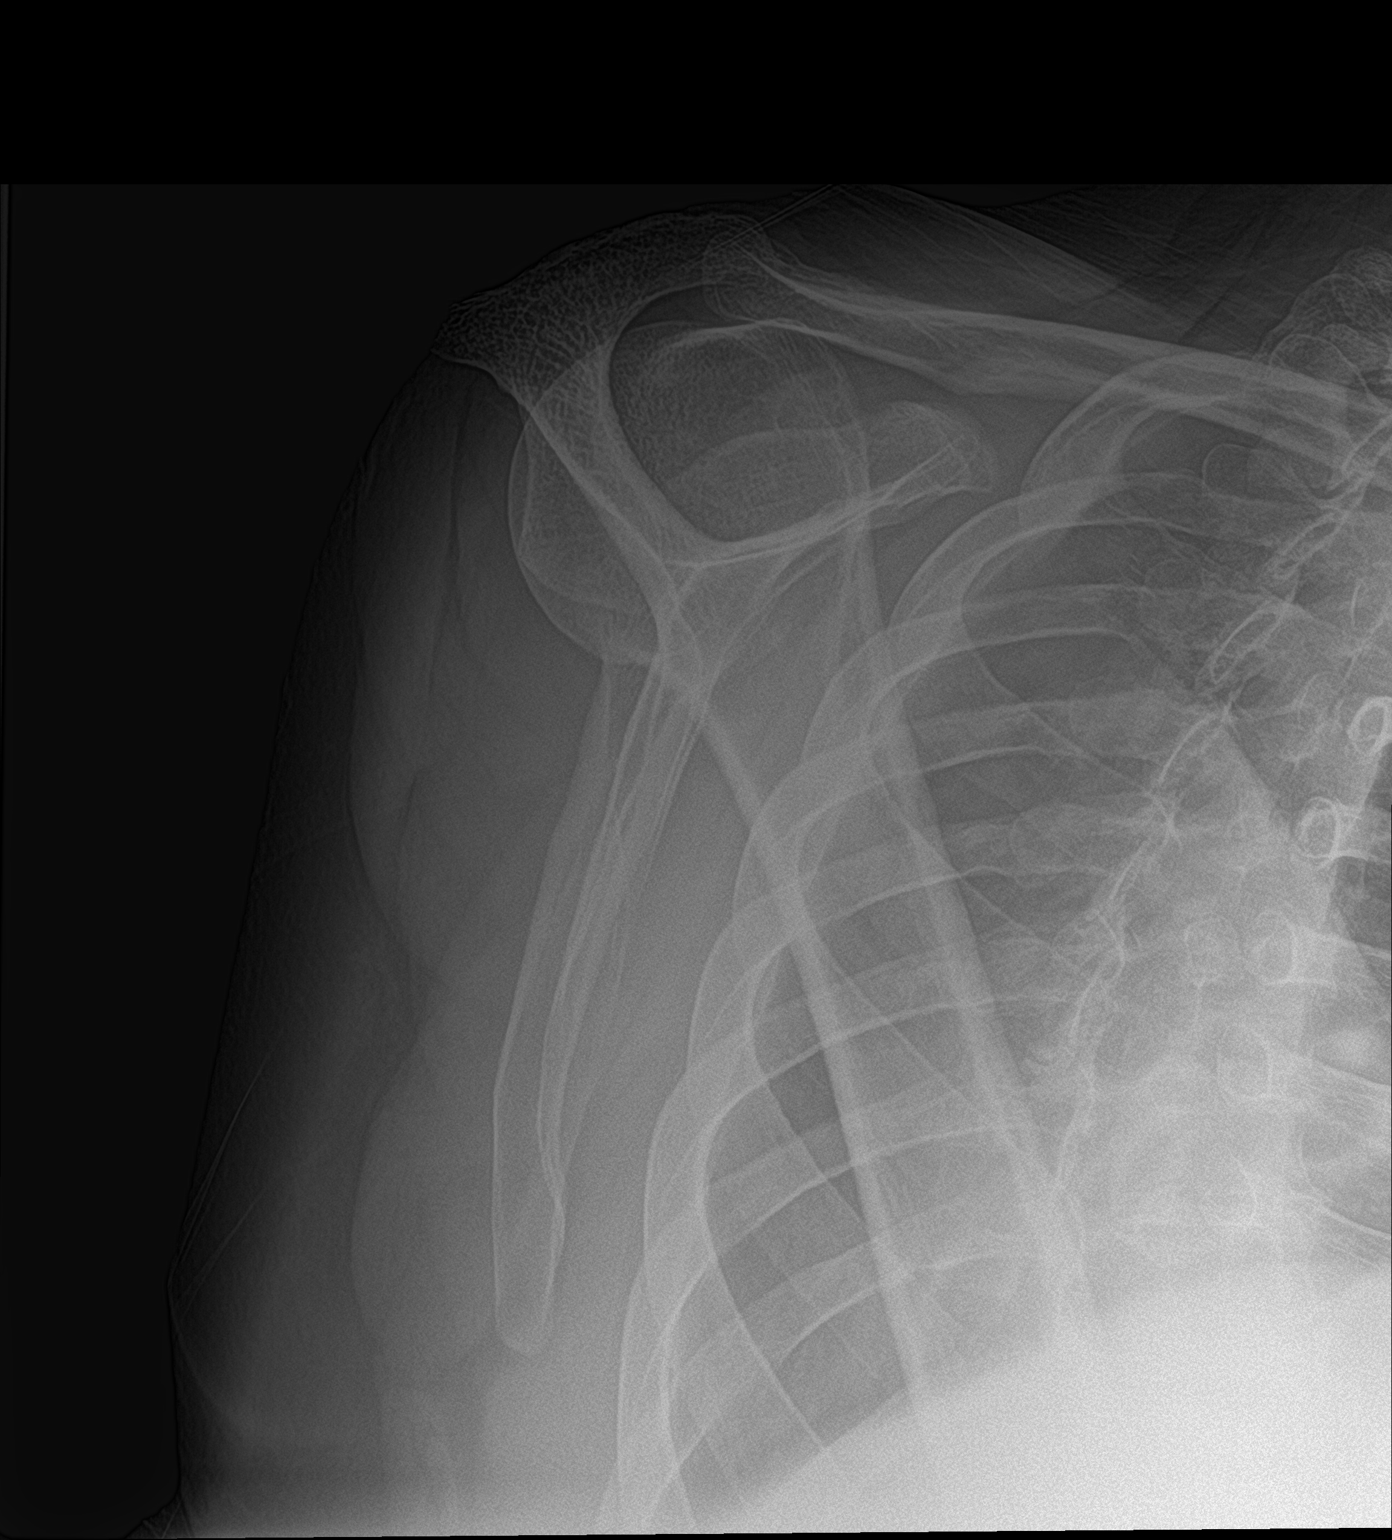

[shoulder axillary]
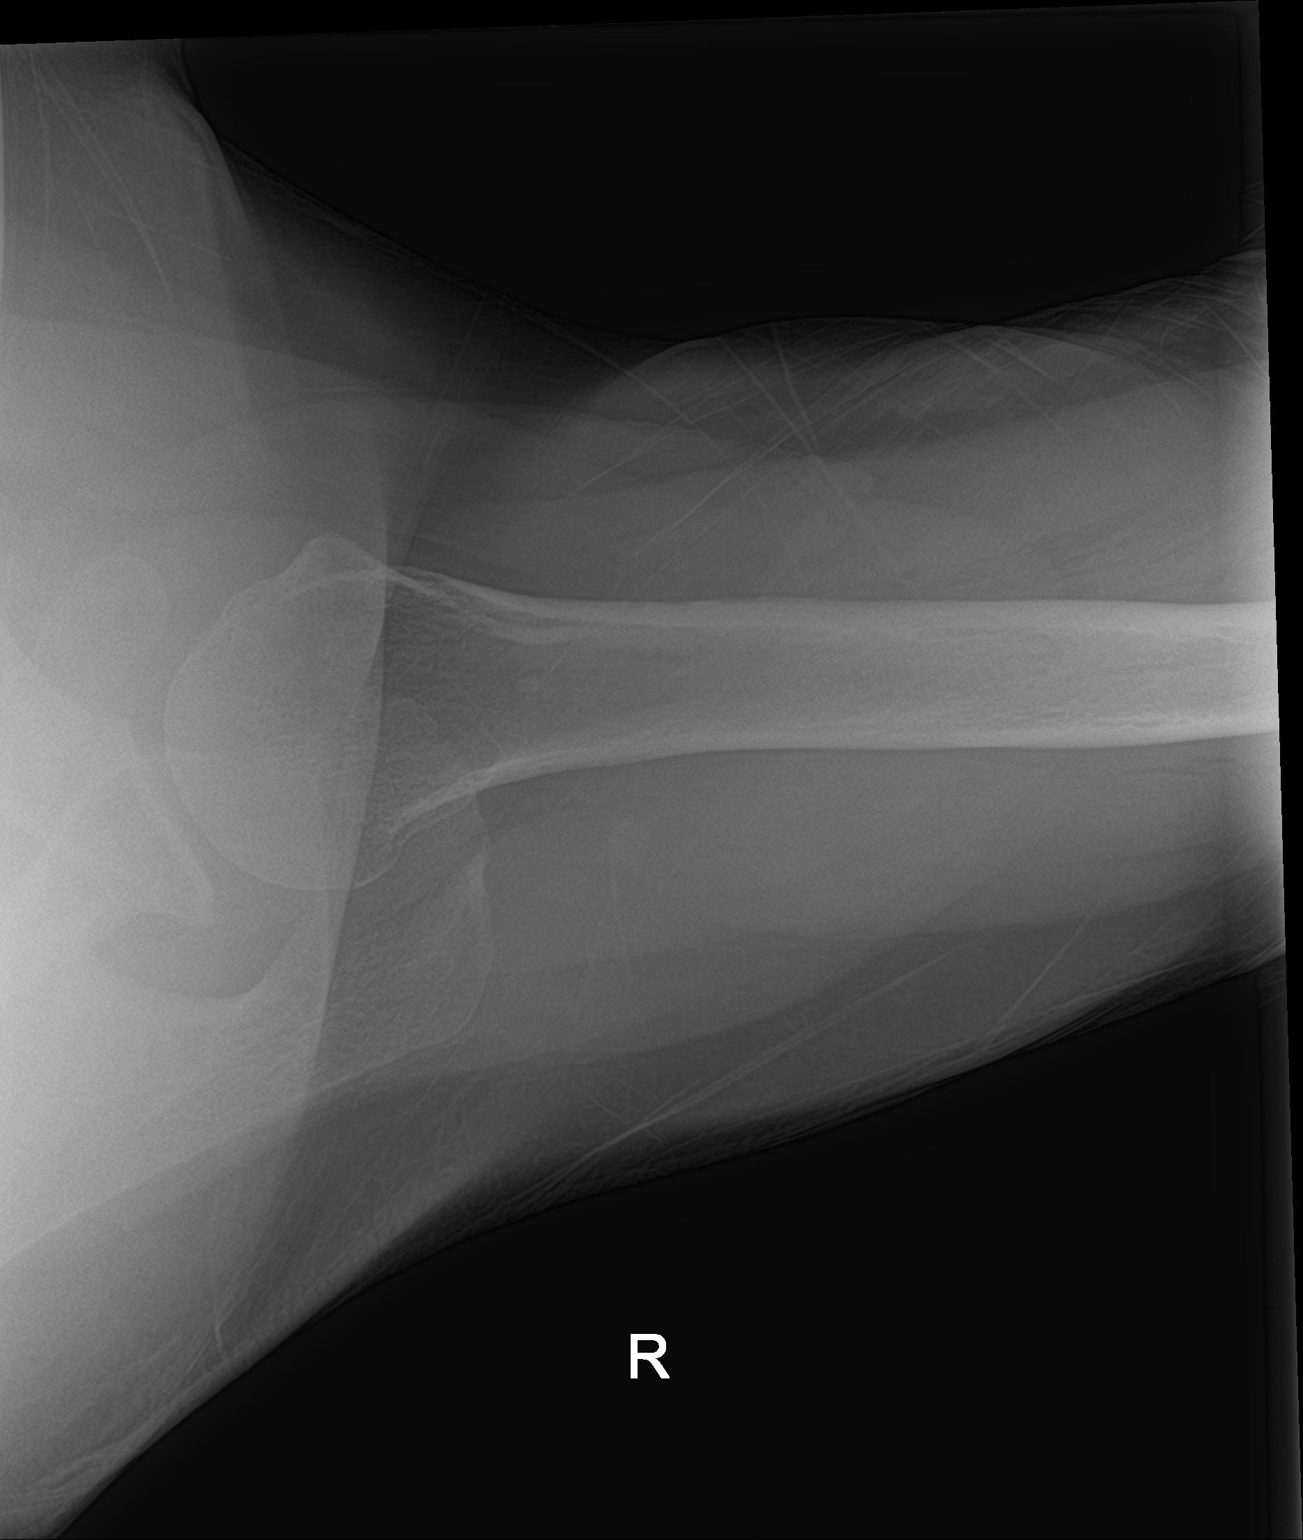

[3 of 3 positions shown; findings below may reference images not displayed]

FINDINGS: There is no evidence of fracture or dislocation. There is no
evidence of arthropathy or other focal bone abnormality. Soft
tissues are unremarkable.
IMPRESSION: Negative.

## 2021-12-23 ENCOUNTER — Emergency Department (INDEPENDENT_AMBULATORY_CARE_PROVIDER_SITE_OTHER)
Admission: EM | Admit: 2021-12-23 | Discharge: 2021-12-23 | Disposition: A | Discharge status: Home / Self Care | Payer: 59 | Source: Home / Self Care

## 2021-12-23 DIAGNOSIS — S60561A Insect bite (nonvenomous) of right hand, initial encounter: Secondary | ICD-10-CM

## 2021-12-23 DIAGNOSIS — W57XXXA Bitten or stung by nonvenomous insect and other nonvenomous arthropods, initial encounter: Secondary | ICD-10-CM | POA: Diagnosis not present

## 2021-12-23 MED ORDER — AMOXICILLIN-POT CLAVULANATE 875-125 MG PO TABS: 1.0000 | ORAL_TABLET | Freq: Two times a day (BID) | ORAL | 0 refills | Status: AC

## 2022-02-05 ENCOUNTER — Encounter (INDEPENDENT_AMBULATORY_CARE_PROVIDER_SITE_OTHER): Payer: Self-pay

## 2022-09-28 IMAGING — DX DG ABDOMEN 1V
3 series · 3 of 3 positions shown · non-contrast
Comparison: None.

CLINICAL DATA: Right flank pain and hematuria.

EXAM:
ABDOMEN - 1 VIEW

[abdomen kub (1 of 3)]
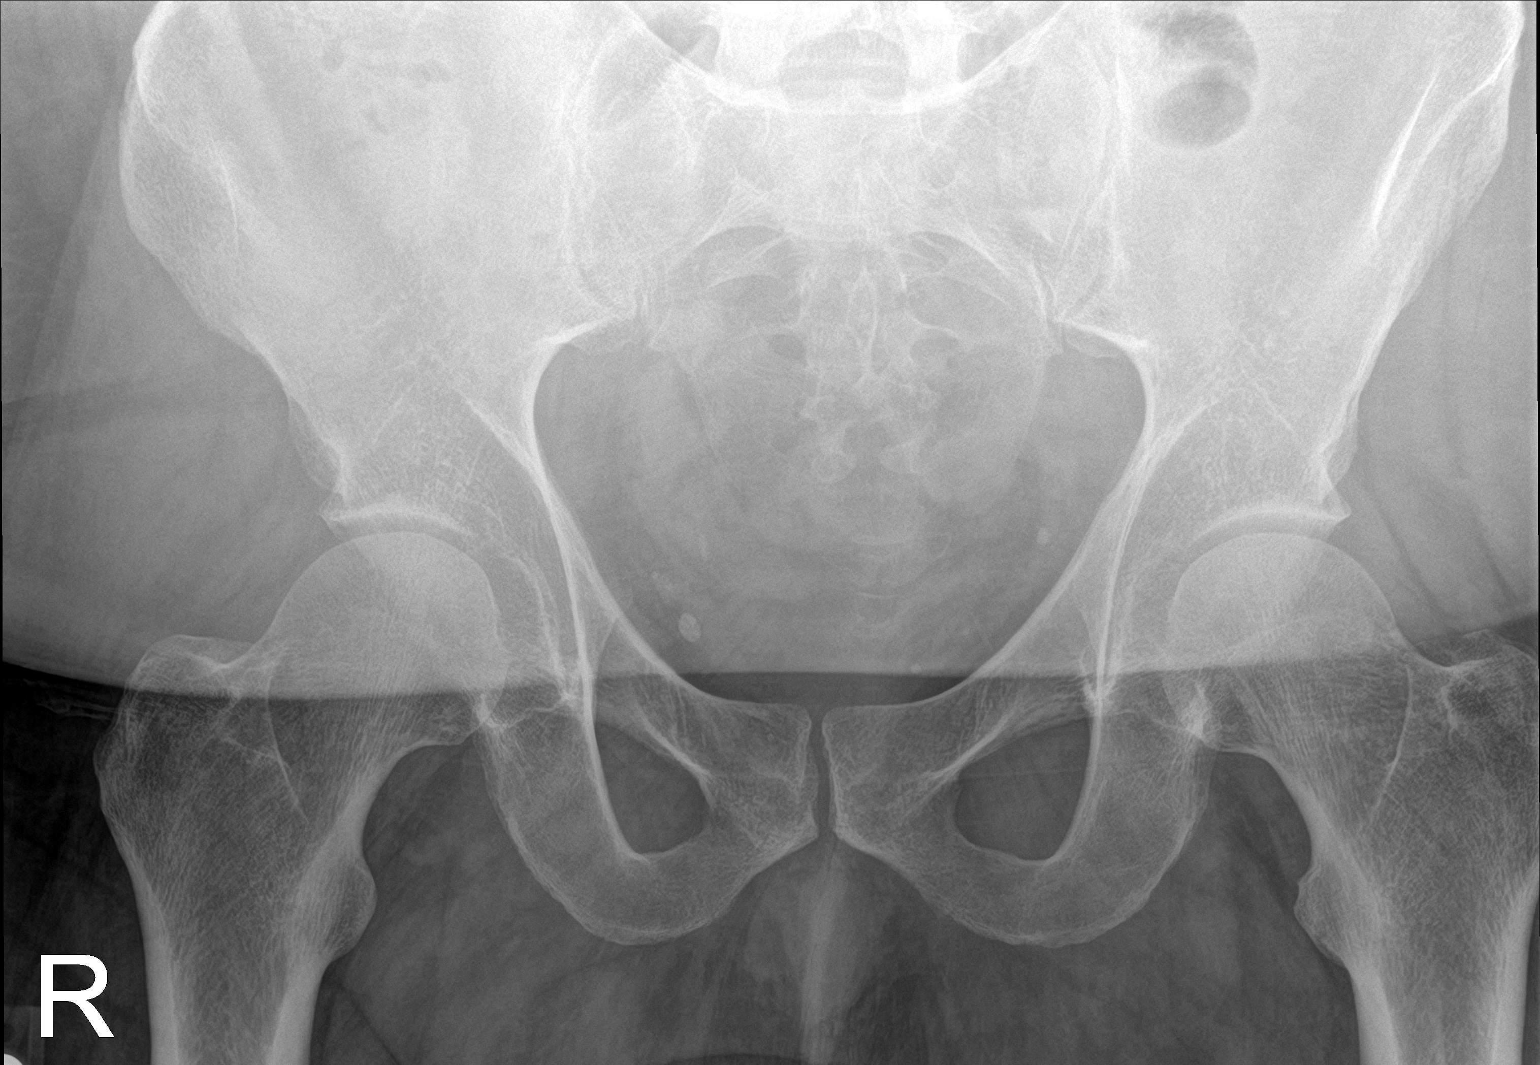

[abdomen kub (2 of 3)]
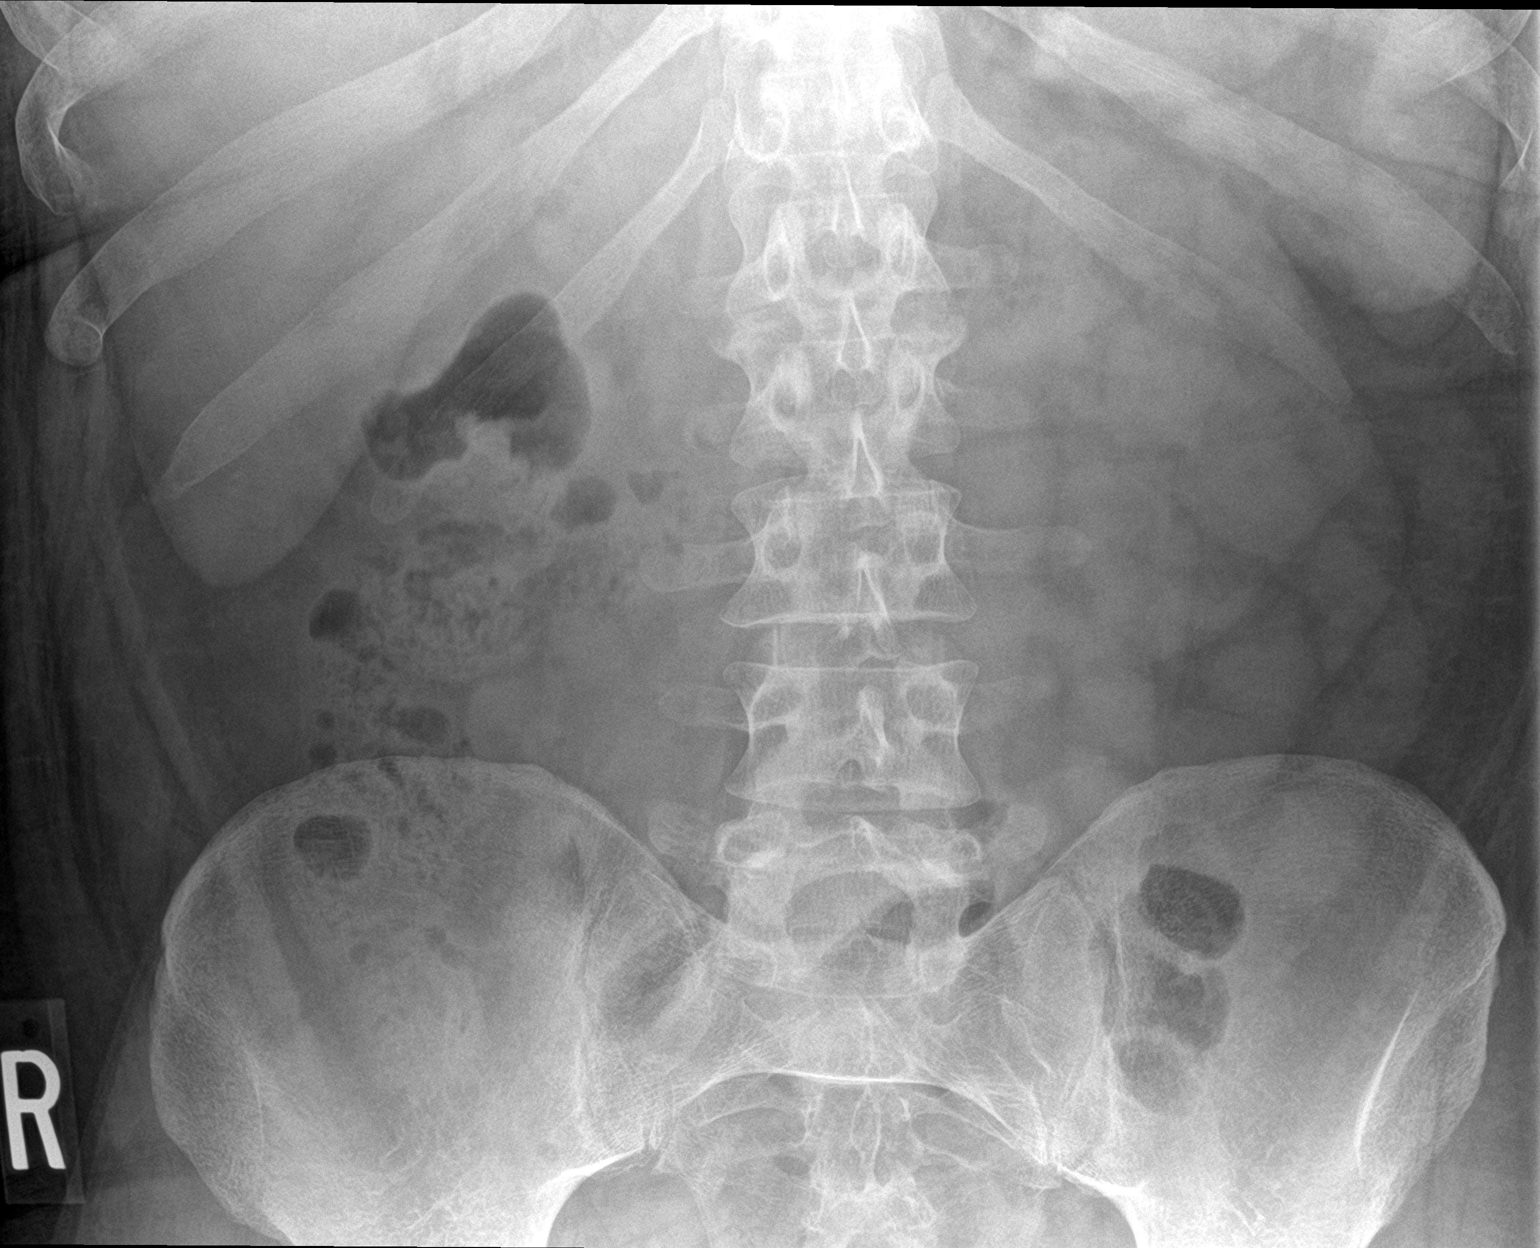

[abdomen kub (3 of 3)]
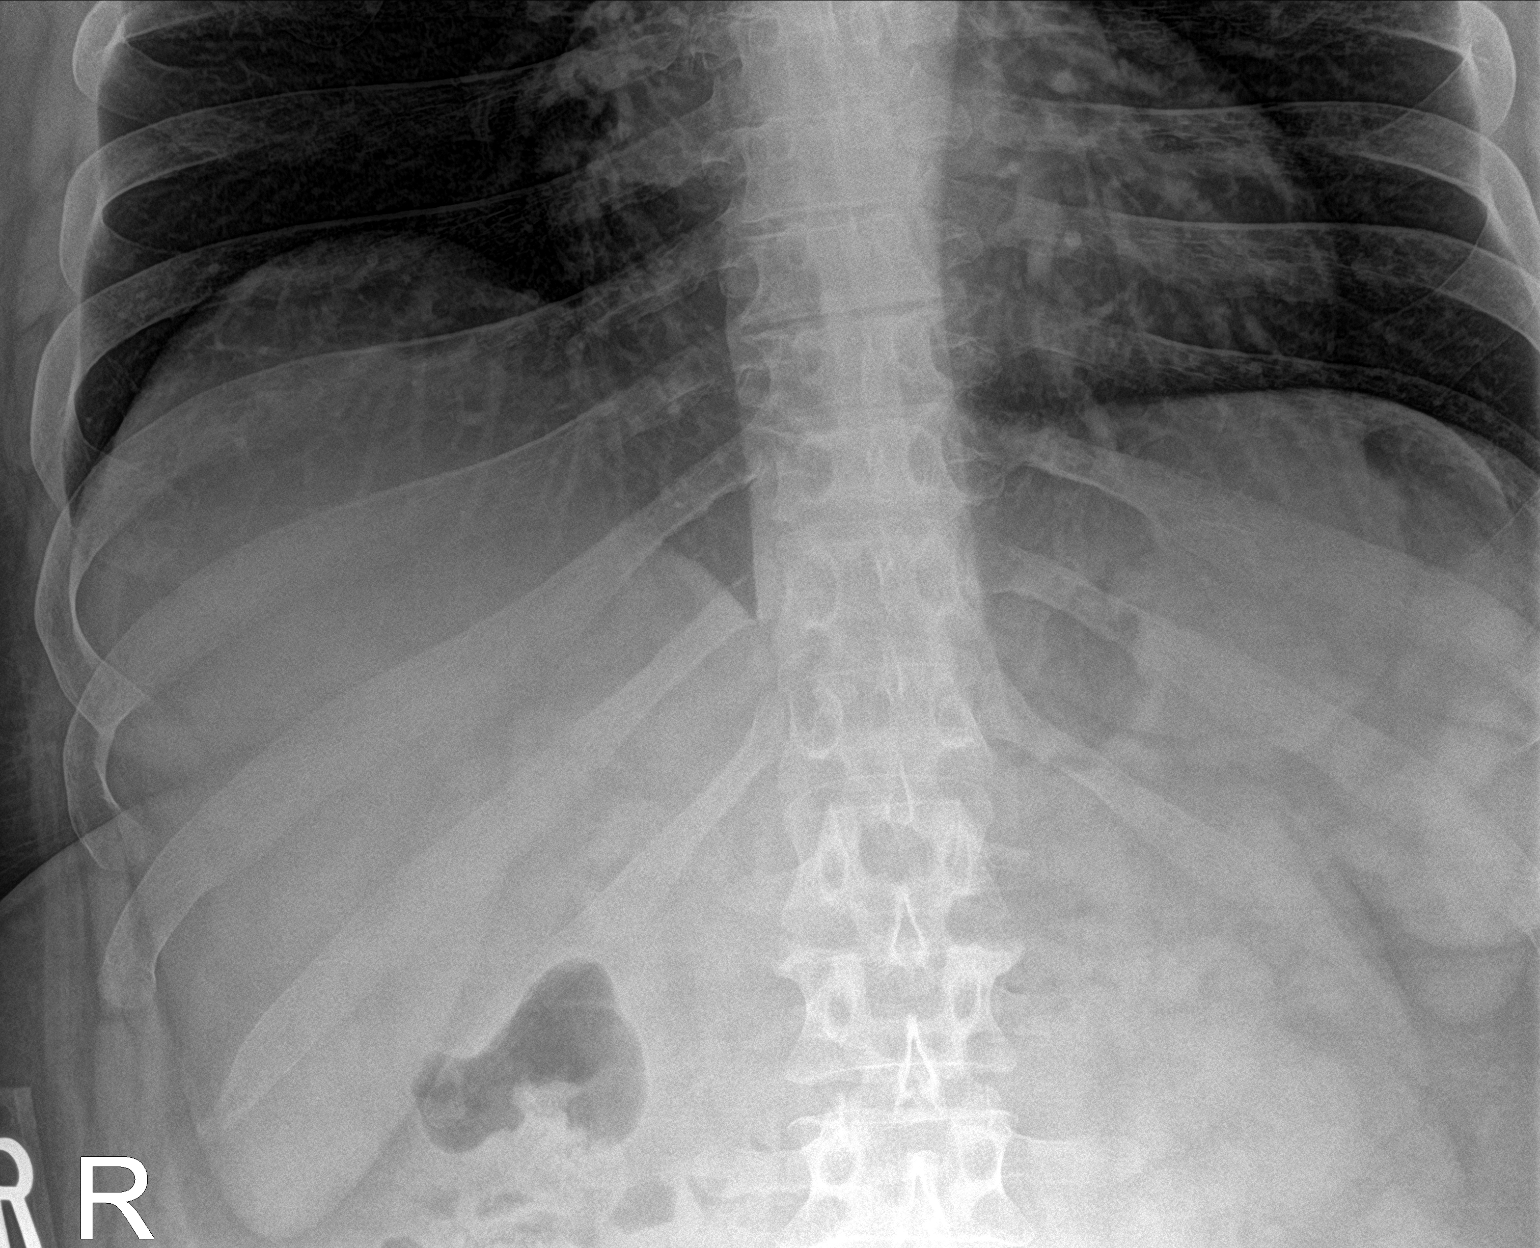

[3 of 3 positions shown; findings below may reference images not displayed]

FINDINGS: There is an ovoid 7 mm calcification in the right pelvis, as well as
the 3 mm calcification in the left pelvis. There is are typical of
phleboliths, but nonspecific. No other soft tissue calcifications or
evidence of radiopaque calculi over the renal beds or ureters.
Normal bowel gas pattern without obstruction. No concerning
intraabdominal mass effect. No acute osseous abnormalities are seen.
IMPRESSION: Bilateral pelvic calcifications, 7 mm on the right and 3 mm on the
left are typical of phleboliths, but nonspecific. No other soft
tissue calcifications or evidence of radiopaque calculi.

## 2022-12-29 ENCOUNTER — Other Ambulatory Visit (HOSPITAL_COMMUNITY): Payer: Self-pay

## 2022-12-29 ENCOUNTER — Ambulatory Visit (HOSPITAL_COMMUNITY)
Admission: RE | Admit: 2022-12-29 | Discharge: 2022-12-29 | Disposition: A | Discharge status: Home / Self Care | Payer: 59 | Source: Ambulatory Visit | Attending: Family Medicine | Admitting: Family Medicine

## 2022-12-29 ENCOUNTER — Encounter (HOSPITAL_COMMUNITY): Payer: Self-pay

## 2022-12-29 VITALS — BP 155/105 | HR 73 | Temp 98.7°F | Resp 18

## 2022-12-29 DIAGNOSIS — R109 Unspecified abdominal pain: Secondary | ICD-10-CM | POA: Diagnosis not present

## 2022-12-29 LAB — COMPREHENSIVE METABOLIC PANEL
ALT: 30 U/L (ref 0–44)
AST: 30 U/L (ref 15–41)
Albumin: 4 g/dL (ref 3.5–5.0)
Alkaline Phosphatase: 78 U/L (ref 38–126)
Anion gap: 13 (ref 5–15)
BUN: 18 mg/dL (ref 6–20)
CO2: 22 mmol/L (ref 22–32)
Calcium: 9.3 mg/dL (ref 8.9–10.3)
Chloride: 104 mmol/L (ref 98–111)
Creatinine, Ser: 0.75 mg/dL (ref 0.61–1.24)
GFR, Estimated: >60 mL/min (ref >60)
Glucose, Bld: 83 mg/dL (ref 70–99)
Potassium: 4.3 mmol/L (ref 3.5–5.1)
Sodium: 139 mmol/L (ref 135–145)
Total Bilirubin: 0.5 mg/dL (ref 0.3–1.2)
Total Protein: 7 g/dL (ref 6.5–8.1)

## 2022-12-29 LAB — CBC WITH DIFFERENTIAL/PLATELET
Abs Immature Granulocytes: 0.03 10*3/uL (ref 0.00–0.07)
Basophils Absolute: 0 10*3/uL (ref 0.0–0.1)
Basophils Relative: 0 %
Eosinophils Absolute: 0.1 10*3/uL (ref 0.0–0.5)
Eosinophils Relative: 2 %
HCT: 45.5 % (ref 39.0–52.0)
Hemoglobin: 15.1 g/dL (ref 13.0–17.0)
Immature Granulocytes: 0 %
Lymphocytes Relative: 25 %
Lymphs Abs: 1.8 10*3/uL (ref 0.7–4.0)
MCH: 32.1 pg (ref 26.0–34.0)
MCHC: 33.2 g/dL (ref 30.0–36.0)
MCV: 96.8 fL (ref 80.0–100.0)
Monocytes Absolute: 0.7 10*3/uL (ref 0.1–1.0)
Monocytes Relative: 10 %
Neutro Abs: 4.7 10*3/uL (ref 1.7–7.7)
Neutrophils Relative %: 63 %
Platelets: 218 10*3/uL (ref 150–400)
RBC: 4.7 MIL/uL (ref 4.22–5.81)
RDW: 13.2 % (ref 11.5–15.5)
WBC: 7.4 10*3/uL (ref 4.0–10.5)
nRBC: 0 % (ref 0.0–0.2)

## 2022-12-29 LAB — POCT URINALYSIS DIP (MANUAL ENTRY)
Blood, UA: NEGATIVE
Glucose, UA: NEGATIVE mg/dL
Ketones, POC UA: NEGATIVE mg/dL
Leukocytes, UA: NEGATIVE
Nitrite, UA: NEGATIVE
Spec Grav, UA: >=1.03 — AB (ref 1.010–1.025)
Urobilinogen, UA: 0.2 E.U./dL
pH, UA: 5.5 (ref 5.0–8.0)

## 2022-12-29 MED ORDER — HYDROCODONE-ACETAMINOPHEN 5-325 MG PO TABS
1.0000 | ORAL_TABLET | Freq: Four times a day (QID) | ORAL | 0 refills | Status: AC | PRN
  Filled 2022-12-29: qty 8, 2d supply, fill #0

## 2022-12-29 NOTE — Discharge Instructions (Signed)
Be aware, you have been prescribed pain medications that may cause drowsiness. While taking this medication, do not take any other medications containing acetaminophen (Tylenol). Do not combine with alcohol or recreational drugs. Please do not drive, operate heavy machinery, or take part in activities that require making important decisions while on this medication as your judgement may be clouded.  You have had labs (CBC/CMP) sent today. We will call you with any significant abnormalities or if there is need to begin or change treatment or pursue further follow up.  You may also review your test results online through MyChart. If you do not have a MyChart account, instructions to sign up should be on your discharge paperwork.

## 2022-12-29 NOTE — ED Triage Notes (Signed)
Pt c/o constant dull pain to lt flank x4 days that's getting worse. Denies urinary sx's. Hx of kidney stones a few years ago but had urinary sx's.

## 2022-12-31 NOTE — ED Provider Notes (Signed)
[facility]   [id number] 12/29/22 Arrival Time: 1239  ASSESSMENT & PLAN:  1. Left flank pain    Exam suggests muscular pain. Denies CP/difficulty breathing or pain with deep breaths. Continue ibuprofen with food. Activities as tolerated. Pain medicine to help mainly at night.  U/A without hematuria or signs of infection.  Meds ordered this encounter  Medications   HYDROcodone-acetaminophen (NORCO/VICODIN) 5-325 MG tablet    Sig: Take 1 tablet by mouth every 6 (six) hours as needed for moderate pain or severe pain.    Dispense:  8 tablet    Refill:  0   Pending:   CBC WITH DIFFERENTIAL/PLATELET  COMPREHENSIVE METABOLIC PANEL   Work/school excuse note: not needed. Medication sedation precautions given. Encourage ROM/movement as tolerated.  [place] Controlled Substances Registry consulted for this patient. I feel the risk/benefit ratio today is favorable for proceeding with this prescription for a controlled substance. Medication sedation precautions given.   Recommend:  Follow-up Information     [place] [facility].   Specialty: Emergency Medicine Why: If symptoms worsen in any way. Contact information: [address] [id number] mc [place] [address] [phone]                 Discharge Instructions      Be aware, you have been prescribed pain medications that may cause drowsiness. While taking this medication, do not take any other medications containing acetaminophen (Tylenol). Do not combine with alcohol or recreational drugs. Please do not drive, operate heavy machinery, or take part in activities that require making important decisions while on this medication as your judgement may be clouded.  You have had labs (CBC/CMP) sent today. We will call you with any significant abnormalities or if there is need to begin or change treatment or pursue further follow up.  You may also review your  test results online through MyChart. If you do not have a MyChart account, instructions to sign up should be on your discharge paperwork.       Reviewed expectations re: course of current medical issues. Questions answered. Outlined signs and symptoms indicating need for more acute intervention. Patient verbalized understanding. After Visit Summary given.   SUBJECTIVE: History from: patient.  Nicholas Ortega is a 42 y.o. male who presents with complaint of dull pain to left flank/side; x4 days; gradual onset; a little worse now. Normal bowel/bladder habits. Hx of kidney stones a few years ago but had urinary sx's; this feels different. Pain is affecting sleep at times. Denies specific back/abdominal pain. Denies fever. Ibuprofen with some relief. Has been able to work through the pain. Normal ambulation.  OBJECTIVE:  Vitals:   12/29/22 1318  BP: (!) 155/105  Pulse: 73  Resp: 18  Temp: 98.7 F (37.1 C)  TempSrc: Oral  SpO2: 96%    General appearance: alert; no distress HEENT: [place]; AT Neck: supple with FROM; without midline tenderness Lungs: unlabored respirations Abdomen: soft, non-tender; non-distended Back: moderate and poorly localized tenderness to palpation over left mid-axillary abdominal wall under left ribs; no specific rib tenderness ; FROM at waist; bruising: none; without midline tenderness Extremities: without edema; symmetrical without gross deformities; normal ROM of bilateral LE Skin: warm and dry Neurologic: normal gait; normal sensation and strength of bilateral LE Psychological: alert and cooperative; normal mood and affect  Labs: Results for orders placed or performed during the hospital encounter of 12/29/22  CBC with Differential/Platelet  Result Value Ref Range   WBC  7.4 4.0 - 10.5 K/uL   RBC 4.70 4.22 - 5.81 MIL/uL   Hemoglobin 15.1 13.0 - 17.0 g/dL   HCT 40.9 81.1 - 91.4 %   MCV 96.8 80.0 - 100.0 fL   MCH 32.1 26.0 - 34.0 pg   MCHC 33.2 30.0 -  36.0 g/dL   RDW 78.2 95.6 - 21.3 %   Platelets 218 150 - 400 K/uL   nRBC 0.0 0.0 - 0.2 %   Neutrophils Relative % 63 %   Neutro Abs 4.7 1.7 - 7.7 K/uL   Lymphocytes Relative 25 %   Lymphs Abs 1.8 0.7 - 4.0 K/uL   Monocytes Relative 10 %   Monocytes Absolute 0.7 0.1 - 1.0 K/uL   Eosinophils Relative 2 %   Eosinophils Absolute 0.1 0.0 - 0.5 K/uL   Basophils Relative 0 %   Basophils Absolute 0.0 0.0 - 0.1 K/uL   Immature Granulocytes 0 %   Abs Immature Granulocytes 0.03 0.00 - 0.07 K/uL  Comprehensive metabolic panel  Result Value Ref Range   Sodium 139 135 - 145 mmol/L   Potassium 4.3 3.5 - 5.1 mmol/L   Chloride 104 98 - 111 mmol/L   CO2 22 22 - 32 mmol/L   Glucose, Bld 83 70 - 99 mg/dL   BUN 18 6 - 20 mg/dL   Creatinine, Ser 0.86 0.61 - 1.24 mg/dL   Calcium 9.3 8.9 - 57.8 mg/dL   Total Protein 7.0 6.5 - 8.1 g/dL   Albumin 4.0 3.5 - 5.0 g/dL   AST 30 15 - 41 U/L   ALT 30 0 - 44 U/L   Alkaline Phosphatase 78 38 - 126 U/L   Total Bilirubin 0.5 0.3 - 1.2 mg/dL   GFR, Estimated >46 >96 mL/min   Anion gap 13 5 - 15  POC urinalysis dipstick  Result Value Ref Range   Color, UA yellow yellow   Clarity, UA clear clear   Glucose, UA negative negative mg/dL   Bilirubin, UA small (A) negative   Ketones, POC UA negative negative mg/dL   Spec Grav, UA >=2.952 (A) 1.010 - 1.025   Blood, UA negative negative   pH, UA 5.5 5.0 - 8.0   Protein Ur, POC trace (A) negative mg/dL   Urobilinogen, UA 0.2 0.2 or 1.0 E.U./dL   Nitrite, UA Negative Negative   Leukocytes, UA Negative Negative   Labs Reviewed  POCT URINALYSIS DIP (MANUAL ENTRY) - Abnormal; Notable for the following components:      Result Value   Bilirubin, UA small (*)    Spec Grav, UA >=1.030 (*)    Protein Ur, POC trace (*)    All other components within normal limits  CBC WITH DIFFERENTIAL/PLATELET  COMPREHENSIVE METABOLIC PANEL    Imaging: No results found.  Allergies  Allergen Reactions   Hydrocodone  Palpitations    Past Medical History:  Diagnosis Date   ADD (attention deficit disorder)    Alcohol abuse    Alcohol addiction (HCC)    Alcohol use    Anxiety    Bipolar disorder (manic depression) (HCC)    Depression    Drug addiction in remission (HCC)    Family history of depression    History of drug use    HTN (hypertension)    HTN (hypertension)    Obesity    Social History   Socioeconomic History   Marital status: Married    Spouse name: Not on file   Number of children: Not on  file   Years of education: Not on file   Highest education level: Not on file  Occupational History   Occupation: NURSING TECH     Employer: [employer]   Tobacco Use   Smoking status: Former    Packs/day: 0.50    Years: 20.00    Additional pack years: 0.00    Total pack years: 10.00    Types: Cigars, Cigarettes    Quit date: 06/30/2018    Years since quitting: 4.5   Smokeless tobacco: Never  Vaping Use   Vaping Use: Never used  Substance and Sexual Activity   Alcohol use: Yes    Alcohol/week: 8.0 - 10.0 standard drinks of alcohol    Types: 8 - 10 Standard drinks or equivalent per week    Comment: occ   Drug use: Not Currently   Sexual activity: Yes    Partners: Female, Male    Birth control/protection: Condom  Other Topics Concern   Not on file  Social History Narrative   Not on file   Social Determinants of Health   Financial Resource Strain: Not on file  Food Insecurity: Not on file  Transportation Needs: Not on file  Physical Activity: Not on file  Stress: Not on file  Social Connections: Not on file  Intimate Partner Violence: Not on file   Family History  Problem Relation Age of Onset   Depression Mother    Anxiety disorder Mother    Alcoholism Mother    Transient ischemic attack Father    Depression Father    High blood pressure Father    Stroke Father    Anxiety disorder Father    Alcoholism Father    Depression Brother    Diabetes Brother    Breast  cancer Paternal Grandmother    History reviewed. No pertinent surgical history.    Mardella Layman, MD 12/31/22 1136
